# Patient Record
Sex: Female | Born: 1980 | Race: Black or African American | Hispanic: No | Marital: Single | Smoking: Never smoker
Health system: Southern US, Community
[De-identification: ages and names within clinical notes are randomized; demographics above are authoritative.]

## PROBLEM LIST (undated history)

## (undated) ENCOUNTER — Inpatient Hospital Stay (HOSPITAL_COMMUNITY): Payer: Self-pay

## (undated) DIAGNOSIS — D649 Anemia, unspecified: Secondary | ICD-10-CM

## (undated) DIAGNOSIS — Z789 Other specified health status: Secondary | ICD-10-CM

## (undated) HISTORY — PX: NO PAST SURGERIES: SHX2092

---
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
## 2001-02-17 ENCOUNTER — Emergency Department (HOSPITAL_COMMUNITY): Admission: EM | Admit: 2001-02-17 | Discharge: 2001-02-17 | Payer: Self-pay | Admitting: *Deleted

## 2001-07-04 ENCOUNTER — Emergency Department (HOSPITAL_COMMUNITY): Admission: EM | Admit: 2001-07-04 | Discharge: 2001-07-04 | Payer: Self-pay | Admitting: Emergency Medicine

## 2002-01-23 ENCOUNTER — Emergency Department (HOSPITAL_COMMUNITY): Admission: EM | Admit: 2002-01-23 | Discharge: 2002-01-23 | Payer: Self-pay | Admitting: Emergency Medicine

## 2002-02-23 ENCOUNTER — Emergency Department (HOSPITAL_COMMUNITY): Admission: EM | Admit: 2002-02-23 | Discharge: 2002-02-23 | Payer: Self-pay | Admitting: Emergency Medicine

## 2002-02-23 ENCOUNTER — Encounter: Payer: Self-pay | Admitting: Emergency Medicine

## 2003-05-10 ENCOUNTER — Emergency Department (HOSPITAL_COMMUNITY): Admission: AD | Admit: 2003-05-10 | Discharge: 2003-05-10 | Payer: Self-pay | Admitting: Emergency Medicine

## 2004-02-24 ENCOUNTER — Ambulatory Visit: Payer: Self-pay | Admitting: Family Medicine

## 2004-07-21 ENCOUNTER — Ambulatory Visit: Payer: Self-pay | Admitting: Internal Medicine

## 2006-03-10 ENCOUNTER — Ambulatory Visit: Payer: Self-pay | Admitting: Gynecology

## 2006-03-23 ENCOUNTER — Other Ambulatory Visit: Admission: RE | Admit: 2006-03-23 | Discharge: 2006-03-23 | Payer: Self-pay | Admitting: Gynecology

## 2006-03-23 ENCOUNTER — Ambulatory Visit: Payer: Self-pay | Admitting: Gynecology

## 2006-03-23 ENCOUNTER — Encounter (INDEPENDENT_AMBULATORY_CARE_PROVIDER_SITE_OTHER): Payer: Self-pay | Admitting: Specialist

## 2006-04-16 ENCOUNTER — Inpatient Hospital Stay (HOSPITAL_COMMUNITY): Admission: AD | Admit: 2006-04-16 | Discharge: 2006-04-16 | Payer: Self-pay | Admitting: Obstetrics & Gynecology

## 2006-06-12 ENCOUNTER — Emergency Department (HOSPITAL_COMMUNITY): Admission: EM | Admit: 2006-06-12 | Discharge: 2006-06-12 | Payer: Self-pay | Admitting: Family Medicine

## 2006-08-21 ENCOUNTER — Ambulatory Visit: Payer: Self-pay | Admitting: Family Medicine

## 2006-11-06 ENCOUNTER — Emergency Department (HOSPITAL_COMMUNITY): Admission: EM | Admit: 2006-11-06 | Discharge: 2006-11-06 | Payer: Self-pay | Admitting: Emergency Medicine

## 2007-05-07 ENCOUNTER — Emergency Department (HOSPITAL_COMMUNITY): Admission: EM | Admit: 2007-05-07 | Discharge: 2007-05-07 | Payer: Self-pay | Admitting: Family Medicine

## 2007-06-13 ENCOUNTER — Emergency Department (HOSPITAL_COMMUNITY): Admission: EM | Admit: 2007-06-13 | Discharge: 2007-06-13 | Payer: Self-pay | Admitting: Family Medicine

## 2008-06-10 ENCOUNTER — Emergency Department (HOSPITAL_COMMUNITY): Admission: EM | Admit: 2008-06-10 | Discharge: 2008-06-10 | Payer: Self-pay | Admitting: Family Medicine

## 2008-10-15 ENCOUNTER — Emergency Department (HOSPITAL_COMMUNITY): Admission: EM | Admit: 2008-10-15 | Discharge: 2008-10-16 | Payer: Self-pay | Admitting: Emergency Medicine

## 2010-03-12 ENCOUNTER — Emergency Department (HOSPITAL_COMMUNITY)
Admission: EM | Admit: 2010-03-12 | Discharge: 2010-03-12 | Payer: Self-pay | Source: Home / Self Care | Admitting: Emergency Medicine

## 2010-05-31 LAB — RAPID STREP SCREEN (MED CTR MEBANE ONLY): Streptococcus, Group A Screen (Direct): NEGATIVE

## 2010-08-06 NOTE — Discharge Summary (Signed)
[facility]  Patient:    Morgan Howell, Morgan Howell                        MRN: [id number] Adm. Date:  [id number] Disc. Date: [id number] Attending:  Doug Sou Dictator:   Raynelle Jan                           Discharge Summary  DATE OF BIRTH:                06-May-1980  ADMISSION HISTORY:            This is an 30 year old gravida 2, para 1-0-0-1 with an approximate gestational age of 15-[redacted] weeks gestation who presents to maternity admissions with a 24 hour history of repetitive vomiting progressing to bilious vomiting.  She has a child who has been sick with a similar illness and has been unable to tolerate liquids.  MEDICATIONS:                  None.  ALLERGIES:                    No known drug allergies.  PAST MEDICAL HISTORY:         Significant for chlamydia, trich, and HPV as well as sickle cell trait and anemia.  PHYSICAL EXAMINATION:  VITAL SIGNS:                  Afebrile.  Vitals were stable.  GENERAL:                      She was vomiting and in some distress.  ABDOMEN:                      Fetal heart tones were Dopplered and her abdomen was unremarkable.  LABORATORIES:                 Normal comprehensive metabolic panel.  CBC significant for hemoglobin of 9.3 and hematocrit of 25.4.  Her white count was only 5.9; however, she did have a left shift with 90% neutrophils.  Her wet prep showed many trich and many wbcs.                                Therefore, she was admitted with a viral gastroenteritis and dehydration as well as anemia and trichomoniasis.  HOSPITAL COURSE:              She was admitted and started on IV hydration. She subsequently developed fevers as high as 100.6; however, within 24 hours of her admission she was doing 100% better with no further nausea and vomiting on the morning of discharge.  At discharge her temperature was 100.2.  The rest of her vitals were stable.  Fetal heart tones were Dopplered at  discharge at a rate of 142.  Her examination was significant for a soft abdomen that was nontender, nondistended with positive bowel sounds.  UA at discharge showed moderate leukocyte esterase, negative nitrites, 0-5 wbcs, and trich.  She received her treatment for the trichomoniasis on the seventeenth and took 2 g of Flagyl p.o. x 1.  She will therefore be discharged to home in stable condition.  DISCHARGE MEDICATIONS:        Prenatal vitamins and Tylenol  for temperature.  DISCHARGE DIET:               She is to have a bland diet for the next two to three days until the gastroenteritis passes.  DISCHARGE FOLLOW-UP:          If she continues to do well with no further nausea and vomiting, she can follow up on December 6 with a previously scheduled appointment. DD:  02/06/00 TD:  02/06/00 Job: [id number]

## 2010-08-06 NOTE — Discharge Summary (Signed)
[facility]  Patient:    WINDI, TORO                        MRN: [id number] Adm. Date:  [id number] Disc. Date: [id number] Attending:  Doug Sou Dictator:   Raynelle Jan, M.D.                           Discharge Summary  DATE OF BIRTH:                1980-12-31.  HISTORY OF PRESENT ILLNESS:   This is an 30 year old gravida 2, para 1-0-0-1, with an estimated gestational age at approximately 16-19 weeks who presents with vomiting on the night of admission since 11 p.m. the previous night.  The vomiting is repetitive and is currently bilious.  Her child had been sick with a similar illness within the past week.  MEDICATIONS:                  None.  ALLERGIES:                    No known drug allergies.  PAST MEDICAL HISTORY:         Chlamydia, Trichomonas, sickle cell trait, and anemia.  PHYSICAL EXAMINATION:         On admission, she had a temperature of 98.5, a pulse of 80, respiration were 20, and BP was 101/57.  In general, she was vomiting and in some mild distress.  Abdomen was significant for being soft, nontender, fetal heart tones were dopplered.  Genitalia exam was deferred.  LABORATORIES ON ADMISSION:    UA significant for ketones of 80.  Her comprehensive metabolic panel was within normal limits.  Her CBC just showed anemia with an H&H of 9.3 and 25.4 and a white count 5.9 with a left shift consisting of 90% neutrophils. DD:  02/06/00 TD:  02/06/00 Job: [id number]

## 2010-08-06 NOTE — Group Therapy Note (Signed)
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  Apparently, she had been  originally scheduled for a colposcopy here but in view of the pathology  report and colposcopic report, I took the liberty of discussing this  with the patient, her findings, and she is going to have a LEEP  scheduled.  In the meantime, she will watch a LEEP video.           ______________________________  Ginger Carne, MD     SHB/MEDQ  D:  03/10/2006  T:  03/10/2006  Job:  [id number]

## 2010-08-14 ENCOUNTER — Inpatient Hospital Stay (HOSPITAL_COMMUNITY)
Admission: AD | Admit: 2010-08-14 | Discharge: 2010-08-14 | Disposition: A | Discharge status: Home / Self Care | Payer: 59 | Source: Ambulatory Visit | Attending: Obstetrics and Gynecology | Admitting: Obstetrics and Gynecology

## 2010-08-14 DIAGNOSIS — A6000 Herpesviral infection of urogenital system, unspecified: Secondary | ICD-10-CM

## 2010-08-14 DIAGNOSIS — N949 Unspecified condition associated with female genital organs and menstrual cycle: Secondary | ICD-10-CM

## 2010-08-14 DIAGNOSIS — J069 Acute upper respiratory infection, unspecified: Secondary | ICD-10-CM

## 2010-08-14 LAB — URINALYSIS, ROUTINE W REFLEX MICROSCOPIC
Glucose, UA: NEGATIVE mg/dL
Ketones, ur: NEGATIVE mg/dL
Protein, ur: NEGATIVE mg/dL

## 2010-08-14 LAB — WET PREP, GENITAL: Clue Cells Wet Prep HPF POC: NONE SEEN

## 2010-08-14 LAB — URINE MICROSCOPIC-ADD ON

## 2010-08-16 LAB — HERPES SIMPLEX VIRUS CULTURE: Culture: NOT DETECTED

## 2010-08-17 LAB — GC/CHLAMYDIA PROBE AMP, GENITAL: Chlamydia, DNA Probe: NEGATIVE

## 2010-11-30 ENCOUNTER — Ambulatory Visit (INDEPENDENT_AMBULATORY_CARE_PROVIDER_SITE_OTHER): Payer: 59 | Admitting: Family Medicine

## 2010-11-30 ENCOUNTER — Encounter: Payer: Self-pay | Admitting: Family Medicine

## 2010-11-30 VITALS — BP 116/80 | HR 83 | Wt 154.0 lb

## 2010-11-30 DIAGNOSIS — R21 Rash and other nonspecific skin eruption: Secondary | ICD-10-CM

## 2010-11-30 NOTE — Progress Notes (Signed)
  Subjective:    Patient ID: Morgan Howell, female    DOB: [birth year], 30 y.o.   MRN: [id number]  HPI She is here for evaluation of a rash and swelling of the lips. She ate some shellfish and approximately one hour later noted some swelling of the lips. She did not have any itching, wheal or flare. Presently she is not having this symptom was mainly concerned about redness and itching to her arms down to her hands but not including the palms. She does have allergy to strawberries but does not remember taking this.   Review of Systems     Objective:   Physical Exam alert and in no distress. Tympanic membranes and canals are normal. Throat is clear. Tonsils are normal. Neck is supple without adenopathy or thyromegaly. Cardiac exam shows a regular sinus rhythm without murmurs or gallops. Lungs are clear to auscultation. Exam of the skin does show erythema and a fine sandpaper type rash mainly on her forearms. Strep screen is negative       Assessment & Plan:  Dermatitis, etiology unclear Recommend supportive care with Benadryl at night and Claritin during the day area and call if further difficulties.

## 2010-11-30 NOTE — Patient Instructions (Signed)
Use Benadryl at night and Claritin during the day. Keep track of anything that might make this worse.

## 2010-12-31 LAB — URINE MICROSCOPIC-ADD ON

## 2010-12-31 LAB — URINALYSIS, ROUTINE W REFLEX MICROSCOPIC
Bilirubin Urine: NEGATIVE
Glucose, UA: NEGATIVE
Protein, ur: NEGATIVE
Specific Gravity, Urine: 1.015

## 2011-08-10 ENCOUNTER — Other Ambulatory Visit: Payer: Self-pay | Admitting: Family Medicine

## 2011-08-10 DIAGNOSIS — N63 Unspecified lump in unspecified breast: Secondary | ICD-10-CM

## 2011-08-18 ENCOUNTER — Ambulatory Visit
Admission: RE | Admit: 2011-08-18 | Discharge: 2011-08-18 | Disposition: A | Discharge status: Home / Self Care | Payer: 59 | Source: Ambulatory Visit | Attending: Family Medicine | Admitting: Family Medicine

## 2011-08-18 DIAGNOSIS — N63 Unspecified lump in unspecified breast: Secondary | ICD-10-CM

## 2012-01-10 ENCOUNTER — Ambulatory Visit (INDEPENDENT_AMBULATORY_CARE_PROVIDER_SITE_OTHER): Payer: 59 | Admitting: Medical

## 2012-01-10 ENCOUNTER — Encounter: Payer: Self-pay | Admitting: Medical

## 2012-01-10 VITALS — BP 110/80 | HR 64 | Temp 98.5°F | Resp 16 | Wt 178.0 lb

## 2012-01-10 DIAGNOSIS — M79609 Pain in unspecified limb: Secondary | ICD-10-CM

## 2012-01-10 DIAGNOSIS — M62838 Other muscle spasm: Secondary | ICD-10-CM

## 2012-01-10 DIAGNOSIS — M549 Dorsalgia, unspecified: Secondary | ICD-10-CM

## 2012-01-10 DIAGNOSIS — M79602 Pain in left arm: Secondary | ICD-10-CM

## 2012-01-10 DIAGNOSIS — M546 Pain in thoracic spine: Secondary | ICD-10-CM

## 2012-01-10 DIAGNOSIS — R0789 Other chest pain: Secondary | ICD-10-CM

## 2012-01-10 DIAGNOSIS — R071 Chest pain on breathing: Secondary | ICD-10-CM

## 2012-01-10 MED ORDER — DICLOFENAC SODIUM 75 MG PO TBEC: 75.0000 mg | DELAYED_RELEASE_TABLET | Freq: Two times a day (BID) | ORAL | Status: DC

## 2012-01-10 MED ORDER — CYCLOBENZAPRINE HCL 10 MG PO TABS: 10.0000 mg | ORAL_TABLET | Freq: Two times a day (BID) | ORAL | Status: DC | PRN

## 2012-01-10 NOTE — Patient Instructions (Signed)
Chest Wall Pain Chest wall pain is pain in or around the bones and muscles of your chest. It may take up to 6 weeks to get better. It may take longer if you must stay physically active in your work and activities.  CAUSES  Chest wall pain may happen on its own. However, it may be caused by:  A viral illness like the flu.  Injury.  Coughing.  Exercise.  Arthritis.  Fibromyalgia.  Shingles. HOME CARE INSTRUCTIONS   Avoid overtiring physical activity. Try not to strain or perform activities that cause pain. This includes any activities using your chest or your abdominal and side muscles, especially if heavy weights are used.  Put ice on the sore area.  Put ice in a plastic bag.  Place a towel between your skin and the bag.  Leave the ice on for 15 to 20 minutes per hour while awake for the first 2 days.  Only take over-the-counter or prescription medicines for pain, discomfort, or fever as directed by your caregiver. SEEK IMMEDIATE MEDICAL CARE IF:   Your pain increases, or you are very uncomfortable.  You have a fever.  Your chest pain becomes worse.  You have new, unexplained symptoms.  You have nausea or vomiting.  You feel sweaty or lightheaded.  You have a cough with phlegm (sputum), or you cough up blood. MAKE SURE YOU:   Understand these instructions.  Will watch your condition.  Will get help right away if you are not doing well or get worse. Document Released: 03/07/2005 Document Revised: 05/30/2011 Document Reviewed: 11/01/2010 Exit[employer] Patient Information 2013 [employer].  

## 2012-01-10 NOTE — Progress Notes (Signed)
Subjective: Here for c/o chest pain.   She notes one similar episode in the past where she was seen in the ED, diagnosed with chest wall pain which resolved with medication.  She notes no recent heavy lifting, strenuous activity, but over the weekend began having pain in her chest, shoulder, arm, and upper back.  Though it was indigestion so she took anti gas medication without relief.  She notes the pain is "shocking" in the chest and getting some tingling in the left arm, but no associated SOB, sweating, numbness, jaw pain or substernal pressure.   These are intermittent shocking pains lasting for minutes at a time.   She notes no family hx/o heart disease.  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  No hx/o shoulder problems or neck problems. No other aggravating or relieving factors.   No past medical history on file.  ROS as in HPI    Objective:   Physical Exam  Filed Vitals:   01/10/12 1025  BP: 110/80  Pulse: 64  Temp: 98.5 F (36.9 C)  Resp: 16    General appearance: alert, no distress, WD/WN,  AA female Neck: supple, no lymphadenopathy, no thyromegaly, no masses, normal ROM Heart: RRR, normal S1, S2, no murmurs Chest wall tender throughout right side. Lungs: CTA bilaterally, no wheezes, rhonchi, or rales Pulses: 2+ symmetric, upper and lower extremities, normal cap refill MSK: tender along right deltoid, upper arm, mild pain with right shoulder flexion, abduction.  ROM otherwise nontender, negative special tests.  Back: tender left supraspinatus muscle, tender entire right upper paraspinal muscles Ext: no edema Neuro: normal UE strength, sensation and DTRs.  Assessment and Plan :     Encounter Diagnoses  Name Primary?  . Chest wall pain Yes  . Muscle spasm   . Arm pain, left   . Pain, upper back    Etiology appears to be musculoskeletal chest, back and shoulder pain, most likely inflammatory.   Advised relative rest, gentle ROM and stretching exercise, heat, begin  Diclofenac BID, Flexeril QHS, and consider massage therapy in 2-3 days.   If not improving in 1 wk, then return.

## 2012-04-30 ENCOUNTER — Ambulatory Visit (INDEPENDENT_AMBULATORY_CARE_PROVIDER_SITE_OTHER): Payer: 59 | Admitting: Medical

## 2012-04-30 ENCOUNTER — Ambulatory Visit
Admission: RE | Admit: 2012-04-30 | Discharge: 2012-04-30 | Disposition: A | Discharge status: Home / Self Care | Payer: 59 | Source: Ambulatory Visit | Attending: Medical | Admitting: Medical

## 2012-04-30 ENCOUNTER — Encounter: Payer: Self-pay | Admitting: Medical

## 2012-04-30 VITALS — BP 112/80 | HR 68 | Temp 98.0°F | Resp 20 | Wt 175.0 lb

## 2012-04-30 DIAGNOSIS — R059 Cough, unspecified: Secondary | ICD-10-CM

## 2012-04-30 DIAGNOSIS — R52 Pain, unspecified: Secondary | ICD-10-CM

## 2012-04-30 DIAGNOSIS — R61 Generalized hyperhidrosis: Secondary | ICD-10-CM

## 2012-04-30 DIAGNOSIS — R079 Chest pain, unspecified: Secondary | ICD-10-CM

## 2012-04-30 DIAGNOSIS — R05 Cough: Secondary | ICD-10-CM

## 2012-04-30 DIAGNOSIS — H109 Unspecified conjunctivitis: Secondary | ICD-10-CM

## 2012-04-30 LAB — CBC WITH DIFFERENTIAL/PLATELET
Lymphocytes Relative: 22 % (ref 12–46)
Neutro Abs: 5.6 10*3/uL (ref 1.7–7.7)
Platelets: 196 10*3/uL (ref 150–400)
RDW: 12.4 % (ref 11.5–15.5)
WBC: 7.7 10*3/uL (ref 4.0–10.5)

## 2012-04-30 NOTE — Progress Notes (Signed)
Subjective:  Morgan Howell is a 32 y.o. female who presents for cough and illness.  Started 4 days ago as sore throat. Been in bed mostly since last Wednesday.   She reports sore throat, body aches, eyes watery, cough, runny nose, sneezing, but no fever, no nausea, no vomiting, no diarrhea.  No rash.  No sick contacts. Feels like someone beat her up.  Has some productive cough.  Using some tylenol and this is helping.  Using Mucinex.  No other aggravating or relieving factors.   I saw her back in 12/2011 for atypical chest pain, suspected chest wall pain.   However, she used the muscle relaxer, NSAID, but still gets these intermittent chest pains at rest, regardless of activity.  Prior to seeing me in October, had been seen in the ED, diagnosed with chest wall pain which resolved with medication. She notes no recent heavy lifting, strenuous activity.  The pain is "shocking" in the chest and getting some tingling in the left arm, but no associated SOB, sweating, numbness, jaw pain or substernal pressure.  Pains can be out of the blue, crampy spasm like pain under the sternum.  These are intermittent shocking pains lasting for minutes at a time. She notes no family hx/o heart disease. 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 No hx/o shoulder problems or neck problem  The following portions of the patient's history were reviewed and updated as appropriate: allergies, current medications, past family history, past medical history, past social history, past surgical history and problem list.  Objective: Vital signs reviewed  General: Ill-appearing, well-developed, well-nourished Skin: warm, dry HEENT: Nose inflamed and congested, clear conjunctiva, TMs pearly, no sinus tenderness, pharynx with erythema, no exudates Neck: Supple, nontender, shotty cervical adenopathy Chest wall: nontender Heart: Regular rate and rhythm, normal S1, S2, no murmurs Lungs: Clear to auscultation bilaterally, no wheezes,  rales, rhonchi Abdomen: Nontender non distended Extremities: Mild generalized tenderness Pulses normal   Adult ECG Report  Indication: chest pain, atypical  Rate: 82 bpm  Rhythm: normal sinus rhythm  QRS Axis: 60 degrees  PR Interval: 138 ms  QRS Duration: 74 ms  QTc: 436 ms  Conduction Disturbances: none  Other Abnormalities: T wave inversion  Patient's cardiac risk factors are: none.  EKG comparison: none  Narrative Interpretation: T wave inversion V2, no other acute changes  Assessment and Plan: Encounter Diagnoses  Name Primary?  . Cough Yes  . Body aches   . Night sweats   . Chest pain   . Conjunctivitis    Cough, body aches, night sweats - flu test negative.  Will check CBC, CXR.  advised that etiology likely viral respiratory illness, flu like syndrome.  Discussed supportive care including rest, hydration, OTC Tylenol or NSAID for fever, aches, and malaise.  Discussed spread of disease, discussed means of transmission, and possible complications including pneumonia.  If worse or not improving within the next 3-4 days, then call or return.  Chest pain - atypical, but given the ongoing intermittent nature, can't rule out Prinzmetals or other cause.  She didn't have improvment with NSAIDs, muscles relaxers.  Chest wall pain doesn't seem to be etiology currently.  Conjunctivitis - OTC allergy eye drops, rest, should resolve spontaneously.  Gave note for work.

## 2012-05-01 ENCOUNTER — Telehealth: Payer: Self-pay | Admitting: Family Medicine

## 2012-05-01 NOTE — Telephone Encounter (Signed)
Jo added on the BMET and the TSH. CLS I fax over OV note, labs, and EKG to [facility] per Crosby Oyster PA-C. CLS

## 2012-05-01 NOTE — Telephone Encounter (Signed)
Message copied by Janeice Robinson on Tue May 01, 2012  8:52 AM ------      Message from: Jac Canavan      Created: Mon Apr 30, 2012  8:32 PM       pls have Alvino Chapel add TSH, BMET.              Go ahead and make tentative referral to [facility] for atypical chest pain, "rule out prinzmetals"  Send  ------

## 2012-05-08 ENCOUNTER — Telehealth: Payer: Self-pay | Admitting: Family Medicine

## 2012-05-08 NOTE — Telephone Encounter (Signed)
Patient is aware that she needs to come by the office to have a re draw on her lab work. CLs

## 2012-05-08 NOTE — Telephone Encounter (Signed)
Message copied by Janeice Robinson on Tue May 08, 2012  3:21 PM ------      Message from: Jac Canavan      Created: Mon May 07, 2012  8:30 AM       I believe the recent BMET and TSH lab were not able to be run.  pls have her come by at her convenience for those 2 labs.  C/t with plan for cardiology referral. ------

## 2012-05-23 ENCOUNTER — Telehealth: Payer: Self-pay | Admitting: Family Medicine

## 2012-05-23 NOTE — Telephone Encounter (Signed)
Patient has an appointment on 05/24/12 but she called and she rescheduled the appointment for 06/04/12 @ 300 pm to see Dr. Elio Forget. CLS Patient is aware that she needs additional lab work. CLS

## 2012-05-23 NOTE — Telephone Encounter (Signed)
Message copied by Janeice Robinson on Wed May 23, 2012  9:28 AM ------      Message from: Aleen Campi, DAVID S      Created: Wed May 23, 2012  4:10 AM       What is the status of the [facility] referral and coming back in for the 2 additional labs? ------

## 2014-06-05 IMAGING — CR DG CHEST 2V
2 series · 2 of 2 positions shown · non-contrast
Comparison: None.

CLINICAL DATA: Cough, congestion, chest pain and body aches.
Diaphoresis.

CHEST - 2 VIEW

[w chest pa]
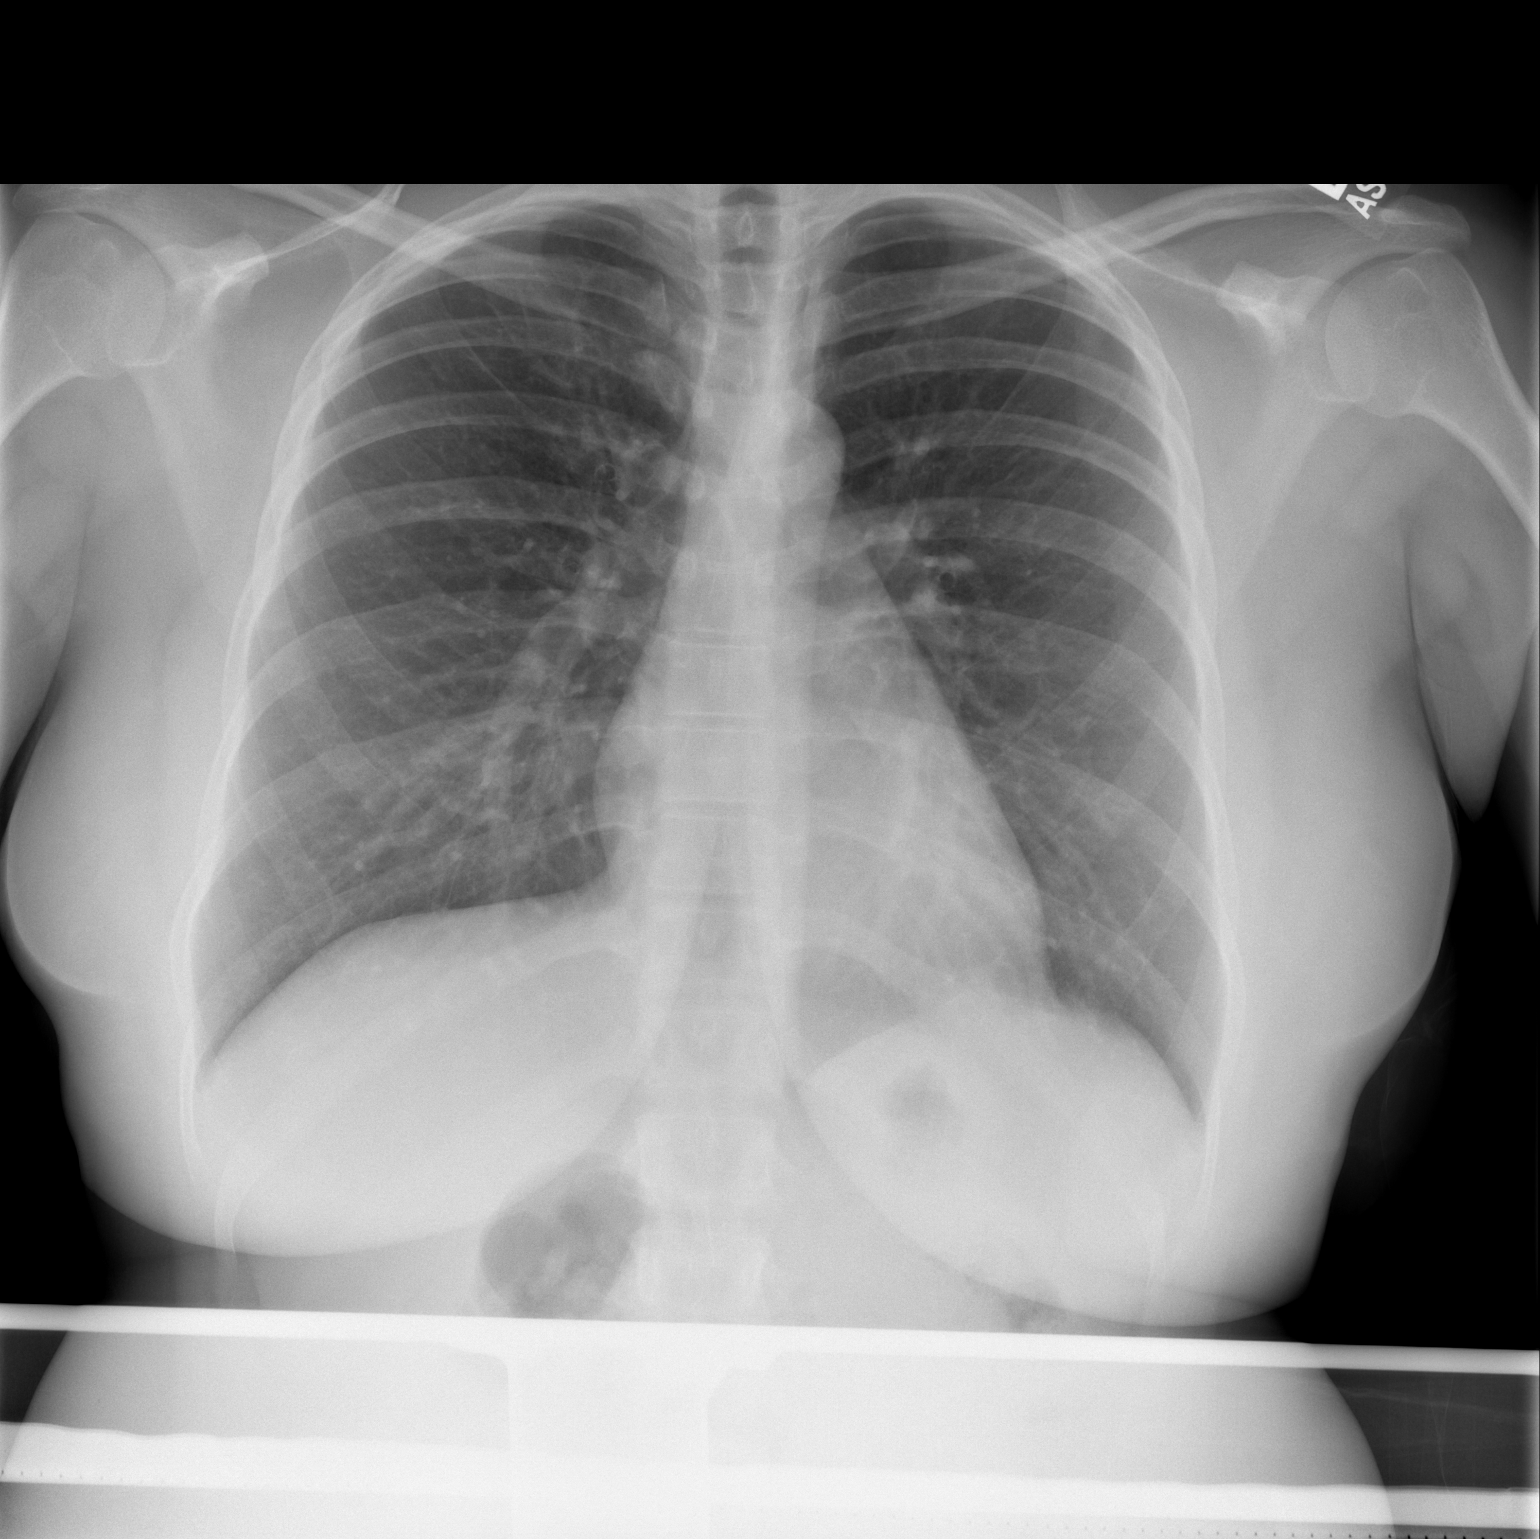

[w chest lat]
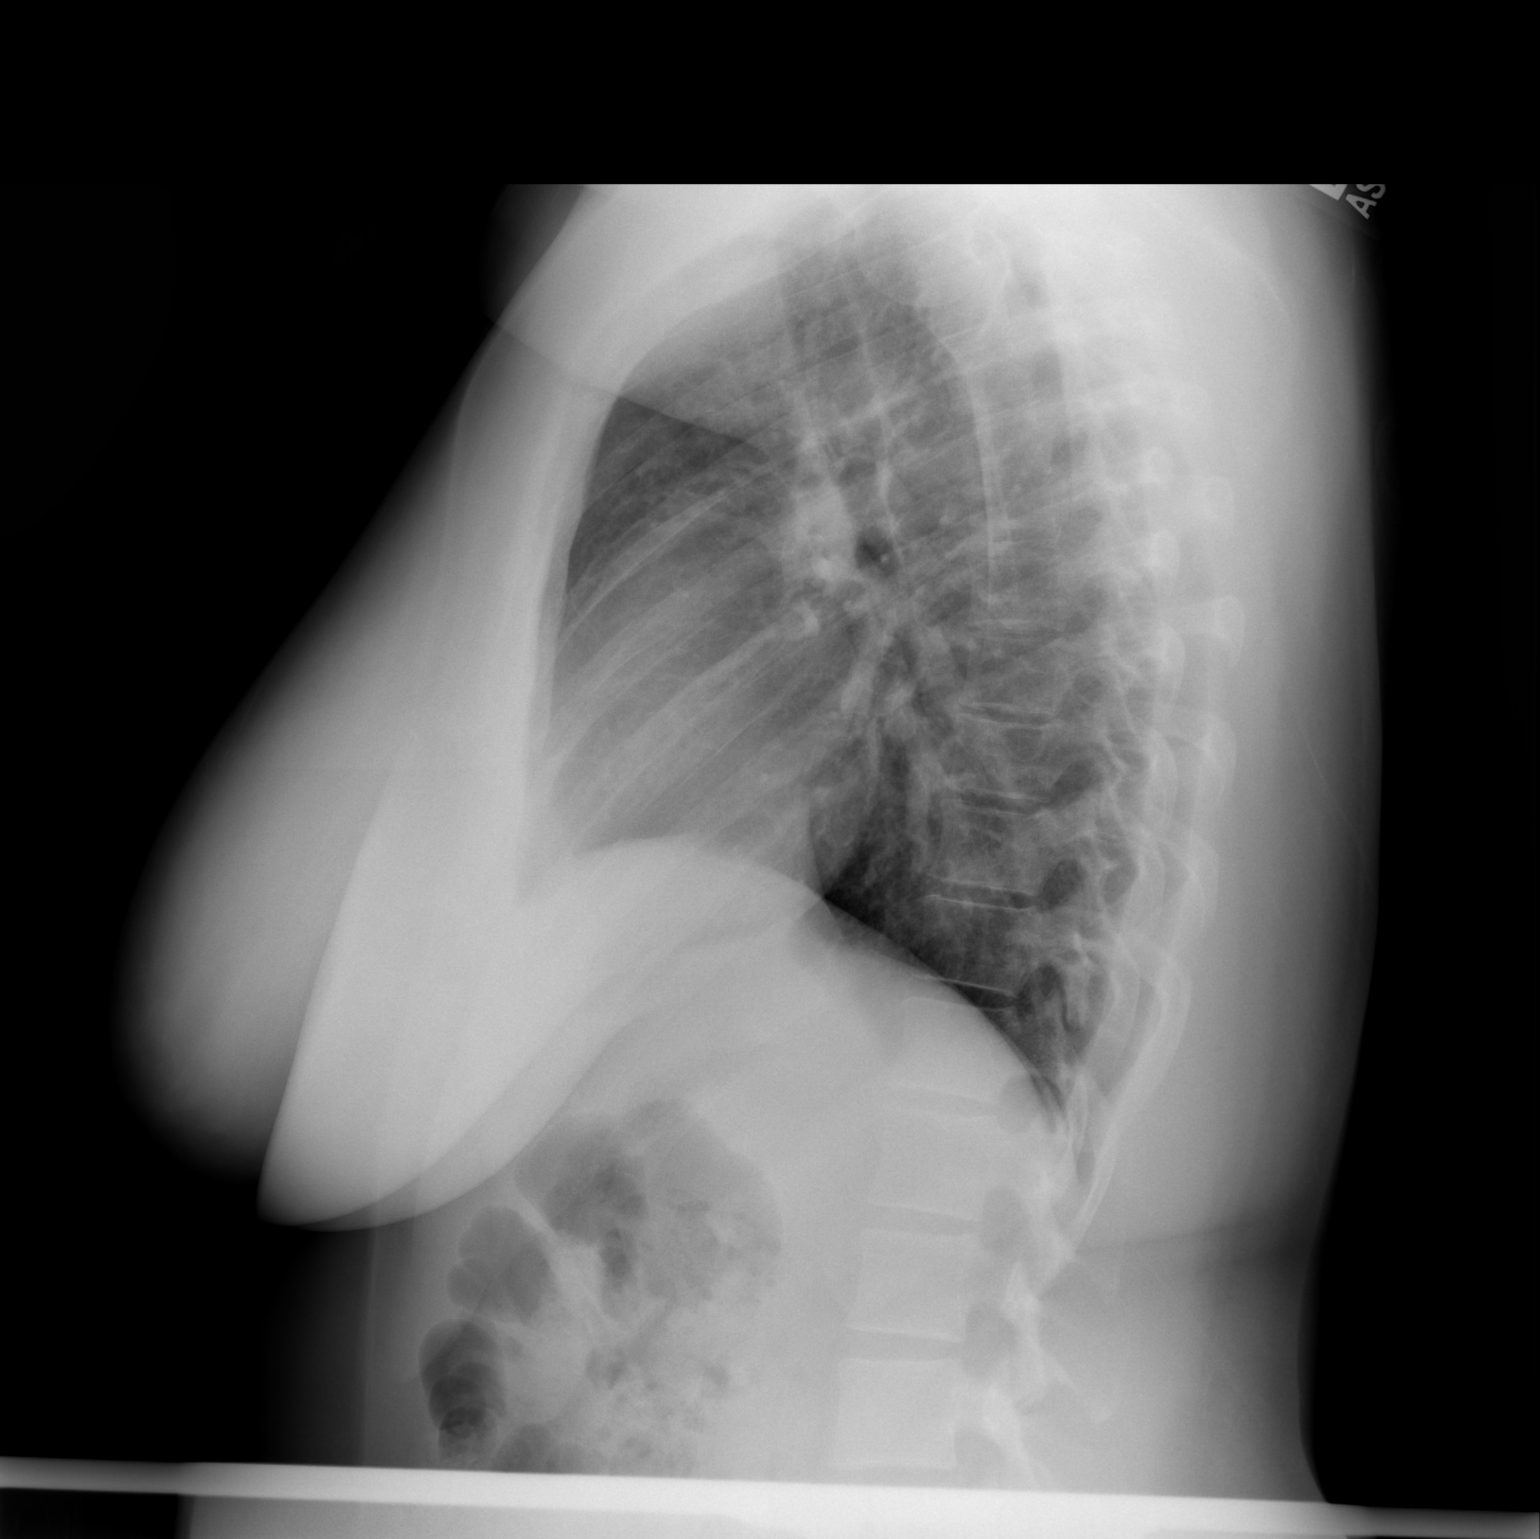

[2 of 2 positions shown; findings below may reference images not displayed]

FINDINGS: Trachea is midline.  Heart size normal.  Lungs are clear.
No pleural fluid.
IMPRESSION: No acute findings.

## 2014-09-08 ENCOUNTER — Other Ambulatory Visit: Payer: Self-pay | Admitting: Nurse Practitioner

## 2014-09-08 DIAGNOSIS — T8389XA Other specified complication of genitourinary prosthetic devices, implants and grafts, initial encounter: Secondary | ICD-10-CM

## 2014-09-10 ENCOUNTER — Ambulatory Visit
Admission: RE | Admit: 2014-09-10 | Discharge: 2014-09-10 | Disposition: A | Discharge status: Home / Self Care | Payer: Self-pay | Source: Ambulatory Visit | Attending: Nurse Practitioner | Admitting: Nurse Practitioner

## 2014-09-10 DIAGNOSIS — T8389XA Other specified complication of genitourinary prosthetic devices, implants and grafts, initial encounter: Secondary | ICD-10-CM

## 2014-10-23 ENCOUNTER — Ambulatory Visit (INDEPENDENT_AMBULATORY_CARE_PROVIDER_SITE_OTHER): Payer: Self-pay | Admitting: Obstetrics & Gynecology

## 2014-10-23 ENCOUNTER — Encounter: Payer: Self-pay | Admitting: Obstetrics & Gynecology

## 2014-10-23 VITALS — BP 120/82 | HR 92 | Ht 61.0 in | Wt 163.3 lb

## 2014-10-23 DIAGNOSIS — Z30432 Encounter for removal of intrauterine contraceptive device: Secondary | ICD-10-CM

## 2014-10-23 DIAGNOSIS — T8332XA Displacement of intrauterine contraceptive device, initial encounter: Secondary | ICD-10-CM

## 2014-10-23 NOTE — Patient Instructions (Signed)
Return to clinic for any scheduled appointments or for any gynecologic concerns as needed.   

## 2014-10-23 NOTE — Progress Notes (Signed)
    GYNECOLOGY CLINIC PROCEDURE ATTEMPT NOTE  Morgan Howell is a 34 y.o. [phone] here for Mirena IUD removal. Was evaluated at [facility] and strings were not visualized.Had follow up ultrasound, see below.  She is currently on Depo Provera. Last pap smear was in 08/2014 and was normal.  09/10/2014   CLINICAL DATA:  IUD loss recently.  Pelvic pain.  EXAM: TRANSABDOMINAL AND TRANSVAGINAL ULTRASOUND OF PELVIS  TECHNIQUE: Both transabdominal and transvaginal ultrasound examinations of the pelvis were performed. Transabdominal technique was performed for global imaging of the pelvis including uterus, ovaries, adnexal regions, and pelvic cul-de-sac. It was necessary to proceed with endovaginal exam following the transabdominal exam to visualize the uterus.  COMPARISON:  None  FINDINGS: Uterus  Measurements: 9.3 x 4.3 x 5.8 cm. 2.4 x 2.6 x 2.6 cm right lateral fibroid.  Endometrium  Thickness: 2 mm. No focal abnormality visualized. An echogenic foci is noted in the lower cervical upper vaginal region. This could represent the lost IUD.  Right ovary  Measurements: 2.5 x 2.5 x 2.1 cm. Multiple follicles .  Left ovary  Measurements: 3.4 x 1.5 x 2.8 cm. Multiple follicles.  Other findings  Trace free pelvic fluid .  IMPRESSION: 1. Echogenic density is noted in the lower cervical upper vaginal region. This could represent the lost IUD.  2.  Uterine fibroid.   Electronically Signed   By: Maisie Fus  Register   On: 09/10/2014 15:57   IUD Removal Attempt Patient identified, informed consent performed, consent signed.  Patient was in the dorsal lithotomy position, normal external genitalia was noted.  A speculum was placed in the patient's vagina, normal discharge was noted, no lesions, no IUD visualized. The cervix was visualized, no lesions, no abnormal discharge, no strings and no IUD.  Cervix was swabbed with Betadine, Kelly forceps were introduced into the endometrial cavity and the IUD was not palpated. IUD hook was used,  nothing was palpated in uterus or cervix.  Procedure was aborted. There was minimal bleeding, and patient tolerated the procedure well.    On personal review of images, nothing was seen in the uterus or cervix corresponding to an IUD. This was communicated to patient. She denies noting expulsion. She was offered pelvic X-ray to evaluate for extrauterine position of the IUD, but she declines for now. Also offered hysteroscopy, but she also declines for now due to cost.    She was advised to call or come in for any concerning symptoms.  Total face-to-face time with patient: 20 minutes. Over 50% of encounter was spent on counseling and coordination of care.   Jaynie Collins, MD, FACOG Attending Obstetrician & Gynecologist Center for [employer], [facility]

## 2014-12-18 ENCOUNTER — Inpatient Hospital Stay (HOSPITAL_COMMUNITY)
Admission: AD | Admit: 2014-12-18 | Discharge: 2014-12-18 | Disposition: A | Discharge status: Home / Self Care | Payer: Self-pay | Source: Ambulatory Visit | Attending: Obstetrics and Gynecology | Admitting: Obstetrics and Gynecology

## 2014-12-18 ENCOUNTER — Encounter (HOSPITAL_COMMUNITY): Payer: Self-pay | Admitting: *Deleted

## 2014-12-18 DIAGNOSIS — N390 Urinary tract infection, site not specified: Secondary | ICD-10-CM | POA: Insufficient documentation

## 2014-12-18 HISTORY — DX: Other specified health status: Z78.9

## 2014-12-18 LAB — URINALYSIS, ROUTINE W REFLEX MICROSCOPIC
BILIRUBIN URINE: NEGATIVE
GLUCOSE, UA: 250 mg/dL — AB
KETONES UR: 15 mg/dL — AB
Nitrite: POSITIVE — AB
PH: 5 (ref 5.0–8.0)
Specific Gravity, Urine: 1.025 (ref 1.005–1.030)
Urobilinogen, UA: >8 mg/dL — ABNORMAL HIGH (ref 0.0–1.0)

## 2014-12-18 LAB — URINE MICROSCOPIC-ADD ON

## 2014-12-18 LAB — POCT PREGNANCY, URINE: Preg Test, Ur: NEGATIVE

## 2014-12-18 MED ORDER — SULFAMETHOXAZOLE-TRIMETHOPRIM 800-160 MG PO TABS: 1.0000 | ORAL_TABLET | Freq: Two times a day (BID) | ORAL | Status: AC

## 2014-12-18 NOTE — Discharge Instructions (Signed)

## 2014-12-18 NOTE — MAU Note (Signed)
Pt reports lower abd pain, urinary frequency, and burning in vaginal area x 4 days.

## 2014-12-18 NOTE — MAU Provider Note (Signed)
History     CSN: [id number]  Arrival date and time: 12/18/14 4098   First Provider Initiated Contact with Patient 12/18/14 0505      No chief complaint on file.  Abdominal Pain This is a new problem. The current episode started in the past 7 days. The onset quality is gradual. The problem occurs intermittently. The problem has been unchanged. The pain is located in the suprapubic region. The pain is at a severity of 7/10. The quality of the pain is burning and cramping. The abdominal pain does not radiate. Associated symptoms include dysuria, frequency, hematuria and nausea. Pertinent negatives include no constipation, diarrhea, fever or vomiting. Nothing aggravates the pain. The pain is relieved by nothing. Treatments tried: urinary relief tablets and cranberry tablets  The treatment provided no relief.    Past Medical History  Diagnosis Date  . Medical history non-contributory     Past Surgical History  Procedure Laterality Date  . No past surgeries      History reviewed. No pertinent family history.  Social History  Substance Use Topics  . Smoking status: Never Smoker   . Smokeless tobacco: Never Used  . Alcohol Use: No    Allergies:  Allergies  Allergen Reactions  . Strawberry Anaphylaxis    Prescriptions prior to admission  Medication Sig Dispense Refill Last Dose  . phenazopyridine (PYRIDIUM) 200 MG tablet Take 200 mg by mouth 3 (three) times daily as needed for pain.   12/17/2014 at 2230    Review of Systems  Constitutional: Negative for fever.  Gastrointestinal: Positive for nausea and abdominal pain. Negative for vomiting, diarrhea and constipation.  Genitourinary: Positive for dysuria, urgency, frequency and hematuria.   Physical Exam   Blood pressure 134/73, pulse 79, temperature 98.2 F (36.8 C), temperature source Oral, resp. rate 18, height  (1.549 m), weight 73.483 kg (162 lb), SpO2 99 %.  Physical Exam  Nursing note and vitals  reviewed. Constitutional: She is oriented to person, place, and time. She appears well-developed and well-nourished. No distress.  HENT:  Head: Normocephalic.  Eyes: EOM are normal.  Cardiovascular: Normal rate.   Respiratory: Effort normal.  GI: Soft.  Musculoskeletal: Normal range of motion.  Neurological: She is alert and oriented to person, place, and time.  Skin: Skin is warm and dry.  Psychiatric: She has a normal mood and affect.   Results for orders placed or performed during the hospital encounter of 12/18/14 (from the past 24 hour(s))  Urinalysis, Routine w reflex microscopic (not at [facility])     Status: Abnormal   Collection Time: 12/18/14  3:55 AM  Result Value Ref Range   Color, Urine YELLOW YELLOW   APPearance CLEAR CLEAR   Specific Gravity, Urine 1.025 1.005 - 1.030   pH 5.0 5.0 - 8.0   Glucose, UA 250 (A) NEGATIVE mg/dL   Hgb urine dipstick LARGE (A) NEGATIVE   Bilirubin Urine NEGATIVE NEGATIVE   Ketones, ur 15 (A) NEGATIVE mg/dL   Protein, ur >119 (A) NEGATIVE mg/dL   Urobilinogen, UA >1.4 (H) 0.0 - 1.0 mg/dL   Nitrite POSITIVE (A) NEGATIVE   Leukocytes, UA MODERATE (A) NEGATIVE  Urine microscopic-add on     Status: Abnormal   Collection Time: 12/18/14  3:55 AM  Result Value Ref Range   Squamous Epithelial / LPF RARE RARE   WBC, UA 21-50 <3 WBC/hpf   RBC / HPF TOO NUMEROUS TO COUNT <3 RBC/hpf   Bacteria, UA MANY (A) RARE  Pregnancy, urine  POC     Status: None   Collection Time: 12/18/14  4:11 AM  Result Value Ref Range   Preg Test, Ur NEGATIVE NEGATIVE    MAU Course  Procedures  MDM   Assessment and Plan   1. UTI (lower urinary tract infection)    DC home Comfort measures reviewed  1st/2nd/3rd Trimester precautions  Bleeding precautions Ectopic precautions PTL precautions  Fetal kick counts RX: bactrim DS #10  Return to MAU as needed FU with OB as planned  Follow-up Information    Follow up with [facility] [place].   Contact  information:   [address] [facility] [address] [phone]        Tawnya Crook 12/18/2014, 5:07 AM

## 2015-03-02 LAB — OB RESULTS CONSOLE HEPATITIS B SURFACE ANTIGEN: HEP B S AG: NEGATIVE

## 2015-03-02 LAB — OB RESULTS CONSOLE RUBELLA ANTIBODY, IGM: RUBELLA: IMMUNE

## 2015-03-02 LAB — OB RESULTS CONSOLE HIV ANTIBODY (ROUTINE TESTING): HIV: NONREACTIVE

## 2015-03-02 LAB — OB RESULTS CONSOLE GC/CHLAMYDIA
CHLAMYDIA, DNA PROBE: NEGATIVE
GC PROBE AMP, GENITAL: NEGATIVE

## 2015-03-02 LAB — OB RESULTS CONSOLE RPR: RPR: NONREACTIVE

## 2015-03-22 NOTE — L&D Delivery Note (Signed)
Delivery Note At 10:22 AM a viable female was delivered via  (Presentation: LOA ).  APGAR: 8 ,9 ; weight pending.   Placenta status: intact, spontaneous .  Cord:  3 vessel with the following complications: none.   Anesthesia:  Fentanyl q1 Episiotomy:  none Lacerations:  1st degree periurethral hemostatic, 1st degree perineal 2 simple interrupted sutures Suture Repair: 3.0 vicryl Est. Blood Loss (mL):  150mL  Mom to postpartum.  Baby to Couplet care / Skin to Skin.  Morgan Howell is a 35 y.o. female G3P3001 with IUP at 9775w1d admitted for IOL for preeclampsia.  She progressed with augmentation to complete and pushed less than 30 minutes to deliver.  Cord clamping delayed by several minutes then clamped by MD and cut by pt.  Placenta intact and spontaneous, bleeding minimal.  1st degree perineal laceration repaired without difficulty.  Mom and baby stable prior to transfer to postpartum. [name].   [name] 10/01/2015, 10:45 AM  I was present for this delivery and agree with the above resident's note. LEFTWICH-KIRBY, Tennelle Taflinger, CNM 4:06 PM

## 2015-08-26 ENCOUNTER — Encounter (HOSPITAL_COMMUNITY): Payer: Self-pay | Admitting: *Deleted

## 2015-08-26 ENCOUNTER — Inpatient Hospital Stay (HOSPITAL_COMMUNITY): Payer: Medicaid Other

## 2015-08-26 ENCOUNTER — Inpatient Hospital Stay (HOSPITAL_COMMUNITY)
Admission: AD | Admit: 2015-08-26 | Discharge: 2015-08-26 | Disposition: A | Discharge status: Home / Self Care | Payer: Medicaid Other | Source: Ambulatory Visit | Attending: Obstetrics and Gynecology | Admitting: Obstetrics and Gynecology

## 2015-08-26 DIAGNOSIS — O9A219 Injury, poisoning and certain other consequences of external causes complicating pregnancy, unspecified trimester: Secondary | ICD-10-CM | POA: Diagnosis not present

## 2015-08-26 DIAGNOSIS — R109 Unspecified abdominal pain: Secondary | ICD-10-CM | POA: Insufficient documentation

## 2015-08-26 DIAGNOSIS — R55 Syncope and collapse: Secondary | ICD-10-CM | POA: Diagnosis not present

## 2015-08-26 DIAGNOSIS — O368130 Decreased fetal movements, third trimester, not applicable or unspecified: Secondary | ICD-10-CM | POA: Insufficient documentation

## 2015-08-26 DIAGNOSIS — O368390 Maternal care for abnormalities of the fetal heart rate or rhythm, unspecified trimester, not applicable or unspecified: Secondary | ICD-10-CM

## 2015-08-26 DIAGNOSIS — Z3A34 34 weeks gestation of pregnancy: Secondary | ICD-10-CM | POA: Diagnosis not present

## 2015-08-26 DIAGNOSIS — O9989 Other specified diseases and conditions complicating pregnancy, childbirth and the puerperium: Secondary | ICD-10-CM

## 2015-08-26 DIAGNOSIS — T1490 Injury, unspecified: Secondary | ICD-10-CM | POA: Diagnosis not present

## 2015-08-26 HISTORY — DX: Anemia, unspecified: D64.9

## 2015-08-26 LAB — URINALYSIS, ROUTINE W REFLEX MICROSCOPIC
Bilirubin Urine: NEGATIVE
Glucose, UA: NEGATIVE mg/dL
HGB URINE DIPSTICK: NEGATIVE
KETONES UR: NEGATIVE mg/dL
Nitrite: NEGATIVE
PROTEIN: NEGATIVE mg/dL
Specific Gravity, Urine: <1.005 — ABNORMAL LOW (ref 1.005–1.030)
pH: 7 (ref 5.0–8.0)

## 2015-08-26 LAB — URINE MICROSCOPIC-ADD ON

## 2015-08-26 LAB — GLUCOSE, CAPILLARY: Glucose-Capillary: 77 mg/dL (ref 65–99)

## 2015-08-26 NOTE — MAU Note (Signed)
PTstates that she has felt fatigued x2 weeks. Was at work [employer] and states that "she collapsed." Happened around 545pm. States that she "woke up to her co-workers helping her up." Denies vaginal bleeding or LOF. Having some lower abdominal cramping that she rates 6/10. States that she went home and drank some water to see if that helped. Worried because she is having decrease in fetal movement-last felt movement right before she came to MAU.

## 2015-08-26 NOTE — MAU Note (Signed)
Pt states for the last 2 weeks she has felt fatigued , pt states she was at work earlier and she doesn't know what happened but she "woke up" on the ground. States she did not hurt her self. Is not having decreased fetal movement and lower abd pain. Denies bleeding.

## 2015-08-26 NOTE — Discharge Instructions (Signed)
Near-Syncope Near-syncope (commonly known as near fainting) is sudden weakness, dizziness, or feeling like you might pass out. This can happen when getting up or while standing for a long time. It is caused by a sudden decrease in blood flow to the brain, which can occur for various reasons. Most of the reasons are not serious.  HOME CARE Watch your condition for any changes.  Have someone stay with you until you feel stable.  If you feel like you are going to pass out:  Lie down right away.  Prop your feet up if you can.  Breathe deeply and steadily.  Move only when the feeling has gone away. Most of the time, this feeling lasts only a few minutes. You may feel tired for several hours.  Drink enough fluids to keep your pee (urine) clear or pale yellow.  If you are taking blood pressure or heart medicine, stand up slowly.  Follow up with your doctor as told. GET HELP RIGHT AWAY IF:   You have a severe headache.  You have unusual pain in the chest, belly (abdomen), or back.  You have bleeding from the mouth or butt (rectum), or you have black or tarry poop (stool).  You feel your heart beat differently than normal, or you have a very fast pulse.  You pass out, or you twitch and shake when you pass out.  You pass out when sitting or lying down.  You feel confused.  You have trouble walking.  You are weak.  You have vision problems. MAKE SURE YOU:   Understand these instructions.  Will watch your condition.  Will get help right away if you are not doing well or get worse.   This information is not intended to replace advice given to you by your health care provider. Make sure you discuss any questions you have with your health care provider.   Document Released: 08/24/2007 Document Revised: 03/28/2014 Document Reviewed: 08/10/2012 Elsevier Interactive Patient Education 2016 Else[employer] Fetal Movement Counts Patient Name:  __________________________________________________ Patient Due Date: ____________________ Performing a fetal movement count is highly recommended in high-risk pregnancies, but it is good for every pregnant woman to do. Your health care provider may ask you to start counting fetal movements at 28 weeks of the pregnancy. Fetal movements often increase: After eating a full meal. After physical activity. After eating or drinking something sweet or cold. At rest. Pay attention to when you feel the baby is most active. This will help you notice a pattern of your baby's sleep and wake cycles and what factors contribute to an increase in fetal movement. It is important to perform a fetal movement count at the same time each day when your baby is normally most active.  HOW TO COUNT FETAL MOVEMENTS Find a quiet and comfortable area to sit or lie down on your left side. Lying on your left side provides the best blood and oxygen circulation to your baby. Write down the day and time on a sheet of paper or in a journal. Start counting kicks, flutters, swishes, rolls, or jabs in a 2-hour period. You should feel at least 10 movements within 2 hours. If you do not feel 10 movements in 2 hours, wait 2-3 hours and count again. Look for a change in the pattern or not enough counts in 2 hours. SEEK MEDICAL CARE IF: You feel less than 10 counts in 2 hours, tried twice. There is no movement in over an hour. The pattern is changing  or taking longer each day to reach 10 counts in 2 hours. You feel the baby is not moving as he or she usually does. Date: ____________ Movements: ____________ Start time: ____________ Doreatha Martin time: ____________  Date: ____________ Movements: ____________ Start time: ____________ Doreatha Martin time: ____________ Date: ____________ Movements: ____________ Start time: ____________ Doreatha Martin time: ____________ Date: ____________ Movements: ____________ Start time: ____________ Doreatha Martin time:  ____________ Date: ____________ Movements: ____________ Start time: ____________ Doreatha Martin time: ____________ Date: ____________ Movements: ____________ Start time: ____________ Doreatha Martin time: ____________ Date: ____________ Movements: ____________ Start time: ____________ Doreatha Martin time: ____________ Date: ____________ Movements: ____________ Start time: ____________ Doreatha Martin time: ____________  Date: ____________ Movements: ____________ Start time: ____________ Doreatha Martin time: ____________ Date: ____________ Movements: ____________ Start time: ____________ Doreatha Martin time: ____________ Date: ____________ Movements: ____________ Start time: ____________ Doreatha Martin time: ____________ Date: ____________ Movements: ____________ Start time: ____________ Doreatha Martin time: ____________ Date: ____________ Movements: ____________ Start time: ____________ Doreatha Martin time: ____________ Date: ____________ Movements: ____________ Start time: ____________ Doreatha Martin time: ____________ Date: ____________ Movements: ____________ Start time: ____________ Doreatha Martin time: ____________  Date: ____________ Movements: ____________ Start time: ____________ Doreatha Martin time: ____________ Date: ____________ Movements: ____________ Start time: ____________ Doreatha Martin time: ____________ Date: ____________ Movements: ____________ Start time: ____________ Doreatha Martin time: ____________ Date: ____________ Movements: ____________ Start time: ____________ Doreatha Martin time: ____________ Date: ____________ Movements: ____________ Start time: ____________ Doreatha Martin time: ____________ Date: ____________ Movements: ____________ Start time: ____________ Doreatha Martin time: ____________ Date: ____________ Movements: ____________ Start time: ____________ Doreatha Martin time: ____________  Date: ____________ Movements: ____________ Start time: ____________ Doreatha Martin time: ____________ Date: ____________ Movements: ____________ Start time: ____________ Doreatha Martin time: ____________ Date: ____________ Movements:  ____________ Start time: ____________ Doreatha Martin time: ____________ Date: ____________ Movements: ____________ Start time: ____________ Doreatha Martin time: ____________ Date: ____________ Movements: ____________ Start time: ____________ Doreatha Martin time: ____________ Date: ____________ Movements: ____________ Start time: ____________ Doreatha Martin time: ____________ Date: ____________ Movements: ____________ Start time: ____________ Doreatha Martin time: ____________  Date: ____________ Movements: ____________ Start time: ____________ Doreatha Martin time: ____________ Date: ____________ Movements: ____________ Start time: ____________ Doreatha Martin time: ____________ Date: ____________ Movements: ____________ Start time: ____________ Doreatha Martin time: ____________ Date: ____________ Movements: ____________ Start time: ____________ Doreatha Martin time: ____________ Date: ____________ Movements: ____________ Start time: ____________ Doreatha Martin time: ____________ Date: ____________ Movements: ____________ Start time: ____________ Doreatha Martin time: ____________ Date: ____________ Movements: ____________ Start time: ____________ Doreatha Martin time: ____________  Date: ____________ Movements: ____________ Start time: ____________ Doreatha Martin time: ____________ Date: ____________ Movements: ____________ Start time: ____________ Doreatha Martin time: ____________ Date: ____________ Movements: ____________ Start time: ____________ Doreatha Martin time: ____________ Date: ____________ Movements: ____________ Start time: ____________ Doreatha Martin time: ____________ Date: ____________ Movements: ____________ Start time: ____________ Doreatha Martin time: ____________ Date: ____________ Movements: ____________ Start time: ____________ Doreatha Martin time: ____________ Date: ____________ Movements: ____________ Start time: ____________ Doreatha Martin time: ____________  Date: ____________ Movements: ____________ Start time: ____________ Doreatha Martin time: ____________ Date: ____________ Movements: ____________ Start time: ____________ Doreatha Martin  time: ____________ Date: ____________ Movements: ____________ Start time: ____________ Doreatha Martin time: ____________ Date: ____________ Movements: ____________ Start time: ____________ Doreatha Martin time: ____________ Date: ____________ Movements: ____________ Start time: ____________ Doreatha Martin time: ____________ Date: ____________ Movements: ____________ Start time: ____________ Doreatha Martin time: ____________ Date: ____________ Movements: ____________ Start time: ____________ Doreatha Martin time: ____________  Date: ____________ Movements: ____________ Start time: ____________ Doreatha Martin time: ____________ Date: ____________ Movements: ____________ Start time: ____________ Doreatha Martin time: ____________ Date: ____________ Movements: ____________ Start time: ____________ Doreatha Martin time: ____________ Date: ____________ Movements: ____________ Start time: ____________ Doreatha Martin time: ____________ Date: ____________ Movements: ____________ Start time: ____________ Doreatha Martin time: ____________ Date: ____________ Movements: ____________ Start time: ____________ Doreatha Martin time: ____________   This information  is not intended to replace advice given to you by your health care provider. Make sure you discuss any questions you have with your health care provider.   Document Released: 04/06/2006 Document Revised: 03/28/2014 Document Reviewed: 01/02/2012 Elsevier Interactive Patient Education [employer]

## 2015-08-26 NOTE — MAU Provider Note (Signed)
  History     CSN: [id number]  Arrival date and time: 08/26/15 2055   First Provider Initiated Contact with Patient 08/26/15 2215      No chief complaint on file.  HPI Ms Morgan Howell is a 35yo [id number] @ 34.0wks who presents for eval of decreased FM. Also had abd cramping earlier but has mostly resolved now. No leaking or bldg.  Earlier today she reports her knees buckling and falling down- no abd or head trauma. States she never lost consciousness but felt slightly 'dazed'. Has felt very fatigued x past few weeks; hgb recently dx @ 8mg /dl and started on po FeSO4. [facility] and has been essentially unremarkable other than the anemia.  OB History    Gravida Para Term Preterm AB TAB SAB Ectopic Multiple Living   3 2 2       2       Past Medical History  Diagnosis Date  . Medical history non-contributory   . Anemia     Past Surgical History  Procedure Laterality Date  . No past surgeries      History reviewed. No pertinent family history.  Social History  Substance Use Topics  . Smoking status: Never Smoker   . Smokeless tobacco: Never Used  . Alcohol Use: No    Allergies:  Allergies  Allergen Reactions  . Strawberry Extract Anaphylaxis    Prescriptions prior to admission  Medication Sig Dispense Refill Last Dose  . ferrous sulfate 325 (65 FE) MG tablet Take 325 mg by mouth daily with breakfast.   08/26/2015 at Unknown time  . Prenatal Vit-Fe Fumarate-FA (PRENATAL MULTIVITAMIN) TABS tablet Take 1 tablet by mouth daily at 12 noon.   08/26/2015 at Unknown time    ROS Physical Exam   Blood pressure 109/76, pulse 81, temperature 98.4 F (36.9 C), temperature source Oral, resp. rate 17, height 5' (1.524 m), weight 75.297 kg (166 lb), SpO2 100 %.  Physical Exam  Constitutional: She is oriented to person, place, and time. She appears well-developed.  HENT:  Head: Normocephalic.  Neck: Normal range of motion.  Cardiovascular: Normal rate.    Respiratory: Effort normal.  GI:  EFM 135, some accels, variable to nadir of 115 x 3 mins, spont resolved w/ accels afterwards No ctx per toco  Musculoskeletal: Normal range of motion.  MAE x 4  Neurological: She is alert and oriented to person, place, and time.  Skin: Skin is warm and dry.  Psychiatric: She has a normal mood and affect. Her behavior is normal. Thought content normal.   Urinalysis    Component Value Date/Time   COLORURINE YELLOW 08/26/2015 2105   APPEARANCEUR CLEAR 08/26/2015 2105   LABSPEC <1.005* 08/26/2015 2105   PHURINE 7.0 08/26/2015 2105   GLUCOSEU NEGATIVE 08/26/2015 2105   HGBUR NEGATIVE 08/26/2015 2105   BILIRUBINUR NEGATIVE 08/26/2015 2105   KETONESUR NEGATIVE 08/26/2015 2105   PROTEINUR NEGATIVE 08/26/2015 2105   UROBILINOGEN >8.0* 12/18/2014 0355   NITRITE NEGATIVE 08/26/2015 2105   LEUKOCYTESUR TRACE* 08/26/2015 2105   Micro: SE 0-5, rare bacteria  CBG: 77mg /dl  BPP: 8/8, AFI 62.95MW15.17cm   MAU Course  Procedures  MDM CBG/UA/BPP ordered  Assessment and Plan  IUP@ 34.0wks Near syncope Decreased FM  D/C home w/ fetal kick counts Continue to eat iron-rich foods and take iron supplement F/U as scheduled at next OB appt on Wed  SHAW, Cala BradfordKIMBERLY CNM 08/26/2015, 10:35 PM

## 2015-09-03 LAB — OB RESULTS CONSOLE GBS: GBS: POSITIVE

## 2015-09-30 ENCOUNTER — Inpatient Hospital Stay (HOSPITAL_COMMUNITY)
Admission: AD | Admit: 2015-09-30 | Discharge: 2015-10-02 | DRG: 775 | Disposition: A | Discharge status: Home / Self Care | Payer: Medicaid Other | Source: Ambulatory Visit | Attending: Obstetrics & Gynecology | Admitting: Obstetrics & Gynecology

## 2015-09-30 ENCOUNTER — Encounter (HOSPITAL_COMMUNITY): Payer: Self-pay

## 2015-09-30 DIAGNOSIS — O9902 Anemia complicating childbirth: Secondary | ICD-10-CM | POA: Diagnosis present

## 2015-09-30 DIAGNOSIS — Z3A36 36 weeks gestation of pregnancy: Secondary | ICD-10-CM | POA: Diagnosis not present

## 2015-09-30 DIAGNOSIS — O1415 Severe pre-eclampsia, complicating the puerperium: Secondary | ICD-10-CM | POA: Diagnosis not present

## 2015-09-30 DIAGNOSIS — O1493 Unspecified pre-eclampsia, third trimester: Secondary | ICD-10-CM

## 2015-09-30 DIAGNOSIS — Z9889 Other specified postprocedural states: Secondary | ICD-10-CM

## 2015-09-30 DIAGNOSIS — Z3A39 39 weeks gestation of pregnancy: Secondary | ICD-10-CM

## 2015-09-30 DIAGNOSIS — O1494 Unspecified pre-eclampsia, complicating childbirth: Secondary | ICD-10-CM | POA: Diagnosis not present

## 2015-09-30 DIAGNOSIS — O99824 Streptococcus B carrier state complicating childbirth: Secondary | ICD-10-CM | POA: Diagnosis present

## 2015-09-30 DIAGNOSIS — O1404 Mild to moderate pre-eclampsia, complicating childbirth: Secondary | ICD-10-CM | POA: Diagnosis not present

## 2015-09-30 DIAGNOSIS — D573 Sickle-cell trait: Secondary | ICD-10-CM | POA: Diagnosis present

## 2015-09-30 DIAGNOSIS — R030 Elevated blood-pressure reading, without diagnosis of hypertension: Secondary | ICD-10-CM | POA: Diagnosis not present

## 2015-09-30 DIAGNOSIS — O1490 Unspecified pre-eclampsia, unspecified trimester: Secondary | ICD-10-CM | POA: Diagnosis present

## 2015-09-30 LAB — COMPREHENSIVE METABOLIC PANEL
ALBUMIN: 2.8 g/dL — AB (ref 3.5–5.0)
ALT: 9 U/L — ABNORMAL LOW (ref 14–54)
ANION GAP: 9 (ref 5–15)
AST: 17 U/L (ref 15–41)
Alkaline Phosphatase: 158 U/L — ABNORMAL HIGH (ref 38–126)
BUN: <5 mg/dL — ABNORMAL LOW (ref 6–20)
CALCIUM: 8.4 mg/dL — AB (ref 8.9–10.3)
CO2: 19 mmol/L — AB (ref 22–32)
Chloride: 110 mmol/L (ref 101–111)
Creatinine, Ser: 0.65 mg/dL (ref 0.44–1.00)
GFR calc non Af Amer: >60 mL/min (ref >60)
GLUCOSE: 72 mg/dL (ref 65–99)
POTASSIUM: 3.4 mmol/L — AB (ref 3.5–5.1)
SODIUM: 138 mmol/L (ref 135–145)
TOTAL PROTEIN: 5.7 g/dL — AB (ref 6.5–8.1)
Total Bilirubin: 1.8 mg/dL — ABNORMAL HIGH (ref 0.3–1.2)

## 2015-09-30 LAB — TYPE AND SCREEN
ABO/RH(D): O POS
Antibody Screen: NEGATIVE

## 2015-09-30 LAB — CBC
HCT: 29.4 % — ABNORMAL LOW (ref 36.0–46.0)
Hemoglobin: 9.5 g/dL — ABNORMAL LOW (ref 12.0–15.0)
MCH: 21.3 pg — ABNORMAL LOW (ref 26.0–34.0)
MCHC: 32.3 g/dL (ref 30.0–36.0)
MCV: 66.1 fL — ABNORMAL LOW (ref 78.0–100.0)
Platelets: 179 10*3/uL (ref 150–400)
RBC: 4.45 MIL/uL (ref 3.87–5.11)
RDW: 19.4 % — AB (ref 11.5–15.5)
WBC: 6.1 10*3/uL (ref 4.0–10.5)

## 2015-09-30 LAB — ABO/RH: ABO/RH(D): O POS

## 2015-09-30 LAB — PROTEIN / CREATININE RATIO, URINE
Creatinine, Urine: 213 mg/dL
PROTEIN CREATININE RATIO: 0.36 mg/mg{creat} — AB (ref 0.00–0.15)
TOTAL PROTEIN, URINE: 76 mg/dL

## 2015-09-30 MED ORDER — LIDOCAINE HCL (PF) 1 % IJ SOLN
30.0000 mL | INTRAMUSCULAR | Status: DC | PRN
  Administered 2015-10-01: 30 mL via SUBCUTANEOUS
  Filled 2015-09-30: qty 30

## 2015-09-30 MED ORDER — LACTATED RINGERS IV SOLN
INTRAVENOUS | Status: DC
  Administered 2015-09-30 – 2015-10-01 (×3): via INTRAVENOUS

## 2015-09-30 MED ORDER — SOD CITRATE-CITRIC ACID 500-334 MG/5ML PO SOLN: 30.0000 mL | ORAL | Status: DC | PRN

## 2015-09-30 MED ORDER — OXYTOCIN 40 UNITS IN LACTATED RINGERS INFUSION - SIMPLE MED
2.5000 [IU]/h | INTRAVENOUS | Status: DC
  Administered 2015-10-01: 2.5 [IU]/h via INTRAVENOUS
  Filled 2015-09-30: qty 1000

## 2015-09-30 MED ORDER — OXYCODONE-ACETAMINOPHEN 5-325 MG PO TABS
2.0000 | ORAL_TABLET | ORAL | Status: DC | PRN
Start: 2015-09-30 — End: 2015-10-01

## 2015-09-30 MED ORDER — OXYTOCIN BOLUS FROM INFUSION
500.0000 mL | INTRAVENOUS | Status: DC
  Administered 2015-10-01: 10:00:00 via INTRAVENOUS

## 2015-09-30 MED ORDER — LACTATED RINGERS IV SOLN
500.0000 mL | INTRAVENOUS | Status: DC | PRN
  Administered 2015-09-30: 1000 mL via INTRAVENOUS

## 2015-09-30 MED ORDER — ACETAMINOPHEN 325 MG PO TABS: 650.0000 mg | ORAL_TABLET | ORAL | Status: DC | PRN

## 2015-09-30 MED ORDER — DEXTROSE 5 % IV SOLN
5.0000 10*6.[IU] | Freq: Once | INTRAVENOUS | Status: AC
  Administered 2015-10-01: 5 10*6.[IU] via INTRAVENOUS
  Filled 2015-09-30: qty 5

## 2015-09-30 MED ORDER — OXYCODONE-ACETAMINOPHEN 5-325 MG PO TABS: 1.0000 | ORAL_TABLET | ORAL | Status: DC | PRN

## 2015-09-30 MED ORDER — PENICILLIN G POTASSIUM 5000000 UNITS IJ SOLR
2.5000 10*6.[IU] | INTRAVENOUS | Status: DC
  Administered 2015-10-01: 2.5 10*6.[IU] via INTRAVENOUS
  Filled 2015-09-30 (×4): qty 2.5

## 2015-09-30 MED ORDER — ONDANSETRON HCL 4 MG/2ML IJ SOLN
4.0000 mg | Freq: Four times a day (QID) | INTRAMUSCULAR | Status: DC | PRN
  Administered 2015-10-01: 4 mg via INTRAVENOUS
  Filled 2015-09-30: qty 2

## 2015-09-30 NOTE — H&P (Signed)
Morgan Howell is a 35 y.o. female presenting for evaluation of elevated BP.  Denies headache or visual changes. States has some increase in swelling. .  RN Note: Pt sent from [facility] with elevated BP, pt denies HA, visual changes or epigastric pain. Having intermittent uc's, denies bleeding or LOF.  Maternal Medical History:  Reason for admission: Nausea. Hypertension   Contractions: Frequency: irregular.   Perceived severity is mild.    Fetal activity: Perceived fetal activity is normal.   Last perceived fetal movement was within the past hour.    Prenatal complications: PIH and pre-eclampsia.   No bleeding, oligohydramnios, placental abnormality, polyhydramnios or preterm labor.   Prenatal Complications - Diabetes: none.    OB History    Gravida Para Term Preterm AB TAB SAB Ectopic Multiple Living   Past Medical History  Diagnosis Date  . Medical history non-contributory   . Anemia    Past Surgical History  Procedure Laterality Date  . No past surgeries     Family History: family history is not on file. Social History:  reports that she has never smoked. She has never used smokeless tobacco. She reports that she does not drink alcohol or use illicit drugs.   Prenatal Transfer Tool  Maternal Diabetes: No Genetic Screening: Normal Maternal Ultrasounds/Referrals: Normal Fetal Ultrasounds or other Referrals:  None Maternal Substance Abuse:  No Significant Maternal Medications:  None Significant Maternal Lab Results:  None GBS POSITIVE Other Comments:  Preeclampsia  Review of Systems  Constitutional: Negative for fever, chills and malaise/fatigue.  Eyes: Negative for blurred vision and double vision.  Gastrointestinal: Negative for nausea, vomiting, abdominal pain, diarrhea and constipation.  Neurological: Negative for dizziness, sensory change, speech change, focal weakness, seizures, loss of consciousness and headaches.      Blood pressure  133/97, pulse 77, temperature 98.3 F (36.8 C), temperature source Oral, resp. rate 18. Maternal Exam:  Uterine Assessment: Contraction strength is mild.  Contraction frequency is irregular.   Abdomen: Patient reports no abdominal tenderness. Fundal height is 38.   Estimated fetal weight is 7.   Fetal presentation: vertex  Introitus: Normal vulva. Normal vagina.  Vagina is negative for discharge.  Ferning test: not done.  Nitrazine test: not done. Amniotic fluid character: not assessed.  Pelvis: adequate for delivery.   Cervix: Cervix evaluated by digital exam.     Fetal Exam Fetal Monitor Review: Mode: ultrasound.   Baseline rate: 135.  Variability: moderate (6-25 bpm).   Pattern: accelerations present and no decelerations.    Fetal State Assessment: Category I - tracings are normal.     Physical Exam  Constitutional: She is oriented to person, place, and time. She appears well-developed and well-nourished. No distress.  HENT:  Head: Normocephalic.  Cardiovascular: Normal rate, regular rhythm and normal heart sounds.  Exam reveals no gallop and no friction rub.   No murmur heard. Respiratory: Effort normal and breath sounds normal. No respiratory distress. She has no wheezes. She has no rales.  GI: Soft. She exhibits no distension. There is no tenderness. There is no rebound and no guarding.  Genitourinary: Vagina normal. No vaginal discharge found.  Dilation: 2 Effacement (%): 80 Cervical Position: Posterior Station: -2, -1 Presentation: Vertex Exam by:: Joanann Mies CNM   Musculoskeletal: Normal range of motion. She exhibits edema (trace).  Neurological: She is alert and oriented to person, place, and time. She has normal reflexes. She displays  normal reflexes. She exhibits normal muscle tone.  Skin: Skin is warm and dry.  Psychiatric: She has a normal mood and affect.    Prenatal labs: ABO, Rh:   Antibody:   Rubella:   RPR:    HBsAg:    HIV:    GBS:     Results for orders placed or performed during the hospital encounter of 09/30/15 (from the past 24 hour(s))  Protein / creatinine ratio, urine     Status: Abnormal   Collection Time: 09/30/15  2:50 PM  Result Value Ref Range   Creatinine, Urine 213.00 mg/dL   Total Protein, Urine 76 mg/dL   Protein Creatinine Ratio 0.36 (H) 0.00 - 0.15 mg/mg[Cre]    Assessment/Plan: SIUP at 3762w0d  Preeclampsia with NO severe features Favorable cervix  [facility] consult Dr Debroah LoopArnold Routine orders Induction of labor, probably Pitocin  PCN when active  [facility]aksWILLIAMS,Jaxon Mynhier 09/30/2015, 3:57 PM

## 2015-09-30 NOTE — MAU Note (Signed)
[facility], pt denies HA, visual changes or epigastric pain.  Having intermittent uc's, denies bleeding or LOF.

## 2015-10-01 ENCOUNTER — Encounter (HOSPITAL_COMMUNITY): Payer: Self-pay | Admitting: *Deleted

## 2015-10-01 DIAGNOSIS — Z3A39 39 weeks gestation of pregnancy: Secondary | ICD-10-CM

## 2015-10-01 DIAGNOSIS — O1494 Unspecified pre-eclampsia, complicating childbirth: Secondary | ICD-10-CM

## 2015-10-01 LAB — RPR: RPR Ser Ql: NONREACTIVE

## 2015-10-01 MED ORDER — OXYTOCIN 40 UNITS IN LACTATED RINGERS INFUSION - SIMPLE MED
1.0000 m[IU]/min | INTRAVENOUS | Status: DC
  Administered 2015-10-01: 2 m[IU]/min via INTRAVENOUS

## 2015-10-01 MED ORDER — DIBUCAINE 1 % RE OINT: 1.0000 "application " | TOPICAL_OINTMENT | RECTAL | Status: DC | PRN

## 2015-10-01 MED ORDER — WITCH HAZEL-GLYCERIN EX PADS: 1.0000 "application " | MEDICATED_PAD | CUTANEOUS | Status: DC | PRN

## 2015-10-01 MED ORDER — ONDANSETRON HCL 4 MG PO TABS: 4.0000 mg | ORAL_TABLET | ORAL | Status: DC | PRN

## 2015-10-01 MED ORDER — ZOLPIDEM TARTRATE 5 MG PO TABS: 5.0000 mg | ORAL_TABLET | Freq: Every evening | ORAL | Status: DC | PRN

## 2015-10-01 MED ORDER — TETANUS-DIPHTH-ACELL PERTUSSIS 5-2.5-18.5 LF-MCG/0.5 IM SUSP: 0.5000 mL | Freq: Once | INTRAMUSCULAR | Status: DC

## 2015-10-01 MED ORDER — PRENATAL MULTIVITAMIN CH: 1.0000 | ORAL_TABLET | Freq: Every day | ORAL | Status: DC

## 2015-10-01 MED ORDER — DIPHENHYDRAMINE HCL 25 MG PO CAPS: 25.0000 mg | ORAL_CAPSULE | Freq: Four times a day (QID) | ORAL | Status: DC | PRN

## 2015-10-01 MED ORDER — BENZOCAINE-MENTHOL 20-0.5 % EX AERO
1.0000 | INHALATION_SPRAY | CUTANEOUS | Status: DC | PRN
Start: 2015-10-01 — End: 2015-10-02

## 2015-10-01 MED ORDER — SIMETHICONE 80 MG PO CHEW
80.0000 mg | CHEWABLE_TABLET | ORAL | Status: DC | PRN
Start: 2015-10-01 — End: 2015-10-02

## 2015-10-01 MED ORDER — IBUPROFEN 600 MG PO TABS
600.0000 mg | ORAL_TABLET | Freq: Four times a day (QID) | ORAL | Status: DC
  Filled 2015-10-01: qty 1

## 2015-10-01 MED ORDER — COCONUT OIL OIL: 1.0000 "application " | TOPICAL_OIL | Status: DC | PRN

## 2015-10-01 MED ORDER — ACETAMINOPHEN 325 MG PO TABS: 650.0000 mg | ORAL_TABLET | ORAL | Status: DC | PRN

## 2015-10-01 MED ORDER — TERBUTALINE SULFATE 1 MG/ML IJ SOLN: 0.2500 mg | Freq: Once | INTRAMUSCULAR | Status: DC | PRN

## 2015-10-01 MED ORDER — FENTANYL CITRATE (PF) 100 MCG/2ML IJ SOLN
100.0000 ug | INTRAMUSCULAR | Status: DC | PRN
  Administered 2015-10-01: 100 ug via INTRAVENOUS
  Filled 2015-10-01 (×2): qty 2

## 2015-10-01 MED ORDER — SENNOSIDES-DOCUSATE SODIUM 8.6-50 MG PO TABS
2.0000 | ORAL_TABLET | ORAL | Status: DC
  Filled 2015-10-01: qty 2

## 2015-10-01 MED ORDER — ONDANSETRON HCL 4 MG/2ML IJ SOLN
4.0000 mg | INTRAMUSCULAR | Status: DC | PRN
Start: 2015-10-01 — End: 2015-10-02

## 2015-10-01 NOTE — Anesthesia Pain Management Evaluation Note (Signed)
  CRNA Pain Management Visit Note  Patient: Morgan Howell, 35 y.o., female  "Hello I am a member of the anesthesia team at [facility]. We have an anesthesia team available at all times to provide care throughout the hospital, including epidural management and anesthesia for C-section. I don't know your plan for the delivery whether it a natural birth, water birth, IV sedation, nitrous supplementation, doula or epidural, but we want to meet your pain goals."   1.Was your pain managed to your expectations on prior hospitalizations?   Yes   2.What is your expectation for pain management during this hospitalization?     IV pain meds and Nitrous Oxide  3.How can we help you reach that goal?  Iv pain, N2O  Record the patient's initial score and the patient's pain goal.   Pain: 5  Pain Goal: 8 [facility] wants you to be able to say your pain was always managed very well.  Morgan Howell 10/01/2015

## 2015-10-02 MED ORDER — IBUPROFEN 600 MG PO TABS: 600.0000 mg | ORAL_TABLET | Freq: Four times a day (QID) | ORAL | Status: DC

## 2015-10-02 MED ORDER — IBUPROFEN 100 MG/5ML PO SUSP
600.0000 mg | Freq: Four times a day (QID) | ORAL | Status: DC
  Administered 2015-10-02: 600 mg via ORAL
  Filled 2015-10-02 (×3): qty 30

## 2015-10-02 MED ORDER — HYDROCHLOROTHIAZIDE 12.5 MG PO TABS: 12.5000 mg | ORAL_TABLET | Freq: Every day | ORAL | Status: DC

## 2015-10-02 NOTE — Discharge Summary (Signed)
OB Discharge Summary     Patient Name: Morgan Howell DOB: Nov 01, 1980 MRN: [id number]  Date of admission: 09/30/2015 Delivering MD: Garth Bigness   Date of discharge: 10/02/2015  Admitting diagnosis: 39WKS, INCREASED BP Intrauterine pregnancy: [redacted]w[redacted]d     Secondary diagnosis:  Active Problems:   H/O LEEP   Sickle cell trait (HCC)   Preeclampsia   NSVD (normal spontaneous vaginal delivery)  Additional problems: none     Discharge diagnosis: Term Pregnancy Delivered and Preeclampsia (mild)                                                                                                Post partum procedures:none  Augmentation: none  Complications: None  Hospital course:    36 y.o. yo G3P2002 at [redacted]w[redacted]d was admitted for preeclampsia at term without severe features  on 09/30/2015. Cx was favorable and she underwent pitocin induction of labor. Patient had an uncomplicated labor course as follows:  Membrane Rupture Time/Date: 10:14 AM ,10/01/2015   Intrapartum Procedures: Episiotomy: None [1]                                         Lacerations:  1st degree [2];Periurethral [8]  Patient had a delivery of a Viable infant. 10/01/2015  Information for the patient's newborn:  Cardelia, Sassano [[id number]]  Delivery Method: Vaginal, Spontaneous Delivery (Filed from Delivery Summary)    Pateint had an uncomplicated postpartum course.  She is ambulating, tolerating a regular diet, passing flatus, and urinating well. On day of discharge, no symptoms of preeclampsia and had elevated SBP upper 150's x 3 overnight. D/W Dr. Debroah Loop re: D/C on HCTZ Patient is discharged home in stable condition on 10/02/2015.    Physical exam  Filed Vitals:   10/01/15 1230 10/01/15 1330 10/01/15 1700 10/02/15 0542  BP: 145/80 154/85 144/81 135/91  Pulse: 55 52 57 57  Temp: 98.2 F (36.8 C) 98.5 F (36.9 C) 98.5 F (36.9 C) 98.4 F (36.9 C)  TempSrc: Oral Oral Oral Oral  Resp: Height:       Weight:      SpO2:       General: alert, cooperative and no distress Lochia: appropriate Uterine Fundus: firm, involuted well  Incision: N/A DVT Evaluation: No evidence of DVT seen on physical exam. Labs: Lab Results  Component Value Date   WBC 6.1 09/30/2015   HGB 9.5* 09/30/2015   HCT 29.4* 09/30/2015   MCV 66.1* 09/30/2015   PLT 179 09/30/2015   CMP Latest Ref Rng 09/30/2015  Glucose 65 - 99 mg/dL 72  BUN 6 - 20 mg/dL <7(W)  Creatinine 2.95 - 1.00 mg/dL 6.21  Sodium 308 - 657 mmol/L 138  Potassium 3.5 - 5.1 mmol/L 3.4(L)  Chloride 101 - 111 mmol/L 110  CO2 22 - 32 mmol/L 19(L)  Calcium 8.9 - 10.3 mg/dL 8.4(O)  Total Protein 6.5 - 8.1 g/dL 9.6(E)  Total Bilirubin 0.3 - 1.2 mg/dL 9.5(M)  Alkaline Phos  38 - 126 U/L 158(H)  AST 15 - 41 U/L 17  ALT 14 - 54 U/L 9(L)    Discharge instruction: per After Visit Summary and "Baby and Me Booklet".  After visit meds:    Medication List    STOP taking these medications        ferrous sulfate 325 (65 FE) MG tablet      TAKE these medications        hydrochlorothiazide 12.5 MG tablet  Commonly known as:  HYDRODIURIL  Take 1 tablet (12.5 mg total) by mouth daily.     ibuprofen 600 MG tablet  Commonly known as:  ADVIL,MOTRIN  Take 1 tablet (600 mg total) by mouth every 6 (six) hours.     prenatal multivitamin Tabs tablet  Take 1 tablet by mouth daily at 12 noon.        Diet: routine diet  Activity: Advance as tolerated. Pelvic rest for 6 weeks.   [facility] 2 days for Home BP check Follow up Appt:No future appointments. Follow up Visit:No Follow-up on file.  Postpartum contraception: Depo Provera  Newborn Data: Live born female  Birth Weight: 6 lb 13.5 oz (3105 g) APGAR: 8, 9  Baby Feeding: Bottle Disposition:home with mother   10/02/2015 Ziquan Fidel, CNM

## 2015-10-02 NOTE — Progress Notes (Signed)
Post Partum Day 1  Subjective:  Morgan Howell is a 35 y.o. G3P3001 6263w1d s/p SVD IOL for pre-e without severe features.  No acute events overnight.  Pt denies problems with ambulating, voiding or po intake.  She denies nausea or vomiting.  Pain is well controlled.  She has had flatus. She has not had bowel movement.  Lochia Small. Denies HA, changes in vision.  Plan for birth control is Depo-Provera.  Method of Feeding: bottle  Objective: BP 135/91 mmHg  Pulse 57  Temp(Src) 98.4 F (36.9 C) (Oral)  Resp 19  Ht 5' (1.524 m)  Wt 77.973 kg (171 lb 14.4 oz)  BMI 33.57 kg/m2  SpO2 100%  Breastfeeding? Unknown  Physical Exam:  General: alert, cooperative and no distress Lochia:normal flow Chest: CTAB Heart: RRR no m/r/g Abdomen: +BS, soft, nontender, fundus firm at/below umbilicus Uterine Fundus: firm DVT Evaluation: No evidence of DVT seen on physical exam. Extremities: no edema   Recent Labs  09/30/15 1623  HGB 9.5*  HCT 29.4*    Assessment/Plan:  ASSESSMENT: Morgan Howell is a 35 y.o. G3P3001 5963w1d ppd #1 s/p NSVD doing well.   Patient doing well overnight. Discharge tomorrow.    LOS: 2 days    Bayard HuggerLauren J Ord 10/02/2015, 7:32 AM   Evaluation and management procedures were performed by Medical student under my supervision/collaboration. Chart reviewed, patient examined by me and I agree with management and plan. See my 10/02/15 Discharge Summary for documentation. Danae Orleanseirdre C Eva Vallee, CNM 10/02/2015 9:15 AM

## 2015-10-02 NOTE — Discharge Instructions (Signed)

## 2015-10-03 DIAGNOSIS — O99824 Streptococcus B carrier state complicating childbirth: Secondary | ICD-10-CM | POA: Diagnosis not present

## 2015-10-03 DIAGNOSIS — Z3A36 36 weeks gestation of pregnancy: Secondary | ICD-10-CM | POA: Diagnosis not present

## 2015-10-06 ENCOUNTER — Inpatient Hospital Stay (HOSPITAL_COMMUNITY)
Admission: AD | Admit: 2015-10-06 | Discharge: 2015-10-08 | DRG: 776 | Disposition: A | Discharge status: Home / Self Care | Payer: BLUE CROSS/BLUE SHIELD | Source: Ambulatory Visit | Attending: Obstetrics & Gynecology | Admitting: Obstetrics & Gynecology

## 2015-10-06 ENCOUNTER — Encounter (HOSPITAL_COMMUNITY): Payer: Self-pay | Admitting: *Deleted

## 2015-10-06 DIAGNOSIS — R030 Elevated blood-pressure reading, without diagnosis of hypertension: Secondary | ICD-10-CM | POA: Diagnosis not present

## 2015-10-06 DIAGNOSIS — O1415 Severe pre-eclampsia, complicating the puerperium: Secondary | ICD-10-CM | POA: Diagnosis not present

## 2015-10-06 LAB — URINALYSIS, ROUTINE W REFLEX MICROSCOPIC
GLUCOSE, UA: NEGATIVE mg/dL
Ketones, ur: 40 mg/dL — AB
Nitrite: NEGATIVE
PROTEIN: 30 mg/dL — AB
Specific Gravity, Urine: 1.01 (ref 1.005–1.030)
pH: 7 (ref 5.0–8.0)

## 2015-10-06 LAB — CBC
HCT: 27.9 % — ABNORMAL LOW (ref 36.0–46.0)
Hemoglobin: 9 g/dL — ABNORMAL LOW (ref 12.0–15.0)
MCH: 21.4 pg — AB (ref 26.0–34.0)
MCHC: 32.3 g/dL (ref 30.0–36.0)
MCV: 66.3 fL — AB (ref 78.0–100.0)
PLATELETS: 252 10*3/uL (ref 150–400)
RBC: 4.21 MIL/uL (ref 3.87–5.11)
RDW: 19.5 % — AB (ref 11.5–15.5)
WBC: 5.7 10*3/uL (ref 4.0–10.5)

## 2015-10-06 LAB — COMPREHENSIVE METABOLIC PANEL
ALK PHOS: 116 U/L (ref 38–126)
ALT: 17 U/L (ref 14–54)
ANION GAP: 10 (ref 5–15)
AST: 21 U/L (ref 15–41)
Albumin: 3.3 g/dL — ABNORMAL LOW (ref 3.5–5.0)
BUN: 6 mg/dL (ref 6–20)
CALCIUM: 8.6 mg/dL — AB (ref 8.9–10.3)
CHLORIDE: 104 mmol/L (ref 101–111)
CO2: 24 mmol/L (ref 22–32)
CREATININE: 0.64 mg/dL (ref 0.44–1.00)
Glucose, Bld: 89 mg/dL (ref 65–99)
Potassium: 3.5 mmol/L (ref 3.5–5.1)
SODIUM: 138 mmol/L (ref 135–145)
Total Bilirubin: 2.1 mg/dL — ABNORMAL HIGH (ref 0.3–1.2)
Total Protein: 6.9 g/dL (ref 6.5–8.1)

## 2015-10-06 LAB — PROTEIN / CREATININE RATIO, URINE
CREATININE, URINE: 185 mg/dL
Protein Creatinine Ratio: 0.4 mg/mg{Cre} — ABNORMAL HIGH (ref 0.00–0.15)
TOTAL PROTEIN, URINE: 74 mg/dL

## 2015-10-06 LAB — URINE MICROSCOPIC-ADD ON

## 2015-10-06 MED ORDER — AMLODIPINE BESYLATE 10 MG PO TABS
10.0000 mg | ORAL_TABLET | Freq: Every day | ORAL | Status: DC
  Administered 2015-10-07 – 2015-10-08 (×2): 10 mg via ORAL
  Filled 2015-10-06 (×3): qty 1

## 2015-10-06 MED ORDER — PRENATAL MULTIVITAMIN CH: 1.0000 | ORAL_TABLET | Freq: Every day | ORAL | Status: DC

## 2015-10-06 MED ORDER — HYDRALAZINE HCL 20 MG/ML IJ SOLN: 10.0000 mg | Freq: Once | INTRAMUSCULAR | Status: DC | PRN

## 2015-10-06 MED ORDER — MAGNESIUM SULFATE BOLUS VIA INFUSION
4.0000 g | Freq: Once | INTRAVENOUS | Status: AC
  Administered 2015-10-06: 4 g via INTRAVENOUS
  Filled 2015-10-06: qty 500

## 2015-10-06 MED ORDER — ACETAMINOPHEN 160 MG/5ML PO SOLN
650.0000 mg | ORAL | Status: DC | PRN
  Administered 2015-10-07: 650 mg via ORAL
  Filled 2015-10-06: qty 20.3

## 2015-10-06 MED ORDER — OXYCODONE HCL 5 MG/5ML PO SOLN
5.0000 mg | ORAL | Status: DC | PRN
  Filled 2015-10-06: qty 5

## 2015-10-06 MED ORDER — MAGNESIUM SULFATE 50 % IJ SOLN
2.0000 g/h | INTRAVENOUS | Status: AC
  Administered 2015-10-07: 2 g/h via INTRAVENOUS
  Filled 2015-10-06 (×2): qty 80

## 2015-10-06 MED ORDER — BUTALBITAL-APAP-CAFFEINE 50-325-40 MG PO TABS: 2.0000 | ORAL_TABLET | Freq: Once | ORAL | Status: DC

## 2015-10-06 MED ORDER — AMLODIPINE BESYLATE 5 MG PO TABS
5.0000 mg | ORAL_TABLET | Freq: Once | ORAL | Status: AC
  Administered 2015-10-06: 5 mg via ORAL
  Filled 2015-10-06: qty 1

## 2015-10-06 MED ORDER — LACTATED RINGERS IV SOLN
INTRAVENOUS | Status: DC
  Administered 2015-10-06 – 2015-10-07 (×2): via INTRAVENOUS

## 2015-10-06 MED ORDER — OXYCODONE HCL 5 MG/5ML PO SOLN: 10.0000 mg | ORAL | Status: DC | PRN

## 2015-10-06 MED ORDER — HYDROCHLOROTHIAZIDE 25 MG PO TABS
25.0000 mg | ORAL_TABLET | Freq: Every day | ORAL | Status: DC
  Administered 2015-10-07 – 2015-10-08 (×2): 25 mg via ORAL
  Filled 2015-10-06 (×3): qty 1

## 2015-10-06 MED ORDER — HYDROCHLOROTHIAZIDE 25 MG PO TABS
25.0000 mg | ORAL_TABLET | Freq: Once | ORAL | Status: AC
  Administered 2015-10-06: 25 mg via ORAL
  Filled 2015-10-06: qty 1

## 2015-10-06 MED ORDER — LABETALOL HCL 5 MG/ML IV SOLN
20.0000 mg | INTRAVENOUS | Status: DC | PRN
  Administered 2015-10-06: 20 mg via INTRAVENOUS
  Filled 2015-10-06: qty 4

## 2015-10-06 NOTE — MAU Provider Note (Signed)
Chief Complaint: Hypertension  First Provider Initiated Contact with Patient 10/06/15 1656      SUBJECTIVE HPI: Morgan Howell is a 35 y.o. G3P3001 at 5 days status post spontaneous vaginal delivery on 10/01/2015 who presents to Maternity Admissions reporting elevated blood pressure per home health nurse and headache since yesterday morning that have not improved with ibuprofen. No history of similar headaches.  Was induced for preeclampsia without severe features and did not receive magnesium sulfate while in the hospital.  Location: Frontal Quality: Throbbing Severity: 6/10 on pain scale Duration: 36 hours Course: Unchanged Timing: Constant Modifying factors: No improvement with ibuprofen, hydration, resting in a dark room Associated signs and symptoms: Negative for fever, chills, vision changes, epigastric pain, nausea, vomiting, sensitivity to light or sound, difficulties with speech or gait, weakness, congestion, sinus pressure.  Past Medical History  Diagnosis Date  . Medical history non-contributory   . Anemia    OB History  Gravida Para Term Preterm AB SAB TAB Ectopic Multiple Living  0 3    # Outcome Date GA Lbr Len/2nd Weight Sex Delivery Anes PTL Lv  3 Term 10/01/15 [redacted]w[redacted]d 07:07 / 00:15 6 lb 13.5 oz (3.105 kg) M Vag-Spont Local  Y     Comments: WNL  2 Term 06/16/00    F Vag-Spont   Y  1 Term 01/04/99    F Vag-Spont   Y     Past Surgical History  Procedure Laterality Date  . No past surgeries     Social History   Social History  . Marital Status: Single    Spouse Name: N/A  . Number of Children: N/A  . Years of Education: N/A   Occupational History  . Not on file.   Social History Main Topics  . Smoking status: Never Smoker   . Smokeless tobacco: Never Used  . Alcohol Use: No  . Drug Use: No  . Sexual Activity: Not Currently   Other Topics Concern  . Not on file   Social History Narrative   No current facility-administered medications  on file prior to encounter.   Current Outpatient Prescriptions on File Prior to Encounter  Medication Sig Dispense Refill  . ibuprofen (ADVIL,MOTRIN) 600 MG tablet Take 1 tablet (600 mg total) by mouth every 6 (six) hours. (Patient not taking: Reported on 10/06/2015) 30 tablet 0   Allergies  Allergen Reactions  . Strawberry Extract Anaphylaxis    I have reviewed the past Medical Hx, Surgical Hx, Social Hx, Allergies and Medications.   Review of Systems  Constitutional: Negative for fever and chills.  HENT: Negative for sinus pressure.   Respiratory: Negative for cough.   Cardiovascular: Negative for leg swelling.  Gastrointestinal: Negative for abdominal pain.  Genitourinary: Positive for vaginal bleeding.  Musculoskeletal: Negative for gait problem.  Neurological: Positive for headaches. Negative for dizziness, facial asymmetry and speech difficulty.    OBJECTIVE Patient Vitals for the past 24 hrs:  BP Temp Temp src Pulse Resp  10/06/15 2117 159/80 mmHg - - 66 -  10/06/15 2102 164/81 mmHg - - 69 -  10/06/15 2047 177/96 mmHg - - 67 -  10/06/15 2032 177/93 mmHg - - 64 -  10/06/15 2017 164/81 mmHg - - 60 -  10/06/15 2002 169/85 mmHg - - 62 -  10/06/15 1947 165/84 mmHg - - 62 -  10/06/15 1932 160/87 mmHg - - 63 -  10/06/15 1915 173/100 mmHg - - 72 -  10/06/15 1900 160/98 mmHg - - 77 -  10/06/15 1847 162/86 mmHg - - 66 -  10/06/15 1830 170/88 mmHg - - 66 -  10/06/15 1821 176/95 mmHg - - 63 -  10/06/15 1758 154/96 mmHg - - 76 -  10/06/15 1730 162/100 mmHg - - 80 -  10/06/15 1715 149/91 mmHg - - 74 -  10/06/15 1700 151/95 mmHg - - 70 -  10/06/15 1646 159/90 mmHg - - 78 -  10/06/15 1630 (!) 166/102 mmHg - - 77 -  10/06/15 1622 149/93 mmHg - - 78 -  10/06/15 1613 142/96 mmHg 98 F (36.7 C) Oral 92 18   Constitutional: Well-developed, well-nourished female in no acute distress.  Cardiovascular: normal rate Respiratory: normal rate and effort.  GI: Abd soft, non-tender. MS:  Extremities nontender, Tr pedal edema, normal ROM Neurologic: Alert and oriented x 4. Deep tendon reflexes 2+. No clonus. GU: Deferred  LAB RESULTS Results for orders placed or performed during the hospital encounter of 10/06/15 (from the past 24 hour(s))  Protein / creatinine ratio, urine     Status: Abnormal   Collection Time: 10/06/15  4:15 PM  Result Value Ref Range   Creatinine, Urine 185.00 mg/dL   Total Protein, Urine 74 mg/dL   Protein Creatinine Ratio 0.40 (H) 0.00 - 0.15 mg/mg[Cre]  Urinalysis, Routine w reflex microscopic (not at [facility])     Status: Abnormal   Collection Time: 10/06/15  4:15 PM  Result Value Ref Range   Color, Urine YELLOW YELLOW   APPearance CLEAR CLEAR   Specific Gravity, Urine 1.010 1.005 - 1.030   pH 7.0 5.0 - 8.0   Glucose, UA NEGATIVE NEGATIVE mg/dL   Hgb urine dipstick LARGE (A) NEGATIVE   Bilirubin Urine SMALL (A) NEGATIVE   Ketones, ur 40 (A) NEGATIVE mg/dL   Protein, ur 30 (A) NEGATIVE mg/dL   Nitrite NEGATIVE NEGATIVE   Leukocytes, UA SMALL (A) NEGATIVE  Urine microscopic-add on     Status: Abnormal   Collection Time: 10/06/15  4:15 PM  Result Value Ref Range   Squamous Epithelial / LPF 0-5 (A) NONE SEEN   WBC, UA 0-5 0 - 5 WBC/hpf   RBC / HPF 0-5 0 - 5 RBC/hpf   Bacteria, UA RARE (A) NONE SEEN  CBC     Status: Abnormal   Collection Time: 10/06/15  4:52 PM  Result Value Ref Range   WBC 5.7 4.0 - 10.5 K/uL   RBC 4.21 3.87 - 5.11 MIL/uL   Hemoglobin 9.0 (L) 12.0 - 15.0 g/dL   HCT 16.1 (L) 09.6 - 04.5 %   MCV 66.3 (L) 78.0 - 100.0 fL   MCH 21.4 (L) 26.0 - 34.0 pg   MCHC 32.3 30.0 - 36.0 g/dL   RDW 40.9 (H) 81.1 - 91.4 %   Platelets 252 150 - 400 K/uL  Comprehensive metabolic panel     Status: Abnormal   Collection Time: 10/06/15  4:52 PM  Result Value Ref Range   Sodium 138 135 - 145 mmol/L   Potassium 3.5 3.5 - 5.1 mmol/L   Chloride 104 101 - 111 mmol/L   CO2 24 22 - 32 mmol/L   Glucose, Bld 89 65 - 99 mg/dL   BUN 6 6 - 20 mg/dL    Creatinine, Ser 7.82 0.44 - 1.00 mg/dL   Calcium 8.6 (L) 8.9 - 10.3 mg/dL   Total Protein 6.9 6.5 - 8.1 g/dL   Albumin 3.3 (L) 3.5 - 5.0 g/dL  AST 21 15 - 41 U/L   ALT 17 14 - 54 U/L   Alkaline Phosphatase 116 38 - 126 U/L   Total Bilirubin 2.1 (H) 0.3 - 1.2 mg/dL   GFR calc non Af Amer >60 >60 mL/min   GFR calc Af Amer >60 >60 mL/min   Anion gap 10 5 - 15    IMAGING No results found.  MAU COURSE CBC, CMP, protein creatinine ratio, cycle blood pressures.  Severe-range blood pressure occurred while RN and CNM observed patient with arm bent, laughing. Blood pressure repeated and no longer in the severe range. Offered Fioricet for headache. Patient declines. Rates her headache as mild to RN.  1800: Discussed history, exam, labs, headache, 2 severe-range blood pressures with Dr. Erin FullingHarraway-Bonny Egger. Does not recommend magnesium sulfate or admission at this time. Patient is candidate for outpatient management. Will increase HCTZ to 25 mg per day and add Norvasc 5 mg per day. Doses given in MAU. Discussed with patient who agrees with plan of care.  1856: Notified Dr. Lowry RamHaraway-Randy Whitener of severe range blood pressures at occurred since previous conversation. Per Dr. Erin FullingHarraway-Navika Hoopes the patient does not need IV BP meds at this time while awaiting onset of PO BP meds. Will continue to observe patient's blood pressure and symptoms in maternity admissions and see how she responds to meds.   2050: Notified Dr. Lowry RamHaraway Melanny Wire of all blood pressures still in the severe range. Will admit for magnesium sulfate and blood pressure management.  MDM - 35 year old female 5 days status post spontaneous vaginal delivery with postpartum preeclampsia with severe features (blood pressure, headache).   ASSESSMENT 1. Pre-eclampsia, severe, postpartum condition    PLAN Admit to women's unit per consult with Dr. Lowry RamHaraway-Uchenna Seufert. Magnesium sulfate 4 g load, 2 g/h for seizure prophylaxis Continue Norvasc and HCTZ. Will  increase Norvasc to 10 mg daily. Tylenol and oxycodone as needed for headache. Patient requests liquid.  [place] Aina [place], [place] 10/06/2015  9:45 PM

## 2015-10-06 NOTE — H&P (Signed)
Expand All Collapse All   Chief Complaint: Hypertension  First Provider Initiated Contact with Patient 10/06/15 1656    SUBJECTIVE HPI: Morgan Howell is a 35 y.o. G3P3001 at 5 days status post spontaneous vaginal delivery on 10/01/2015 who presents to Maternity Admissions reporting elevated blood pressure per home health nurse and headache since yesterday morning that have not improved with ibuprofen. No history of similar headaches.  Was induced for preeclampsia without severe features and did not receive magnesium sulfate while in the hospital.  Location: Frontal Quality: Throbbing Severity: 6/10 on pain scale Duration: 36 hours Course: Unchanged Timing: Constant Modifying factors: No improvement with ibuprofen, hydration, resting in a dark room Associated signs and symptoms: Negative for fever, chills, vision changes, epigastric pain, nausea, vomiting, sensitivity to light or sound, difficulties with speech or gait, weakness, congestion, sinus pressure.  Past Medical History  Diagnosis Date  . Medical history non-contributory   . Anemia    OB History  Gravida Para Term Preterm AB SAB TAB Ectopic Multiple Living  3 3 3       0 3    # Outcome Date GA Lbr Len/2nd Weight Sex Delivery Anes PTL Lv  3 Term 10/01/15 6925w1d 07:07 / 00:15 6 lb 13.5 oz (3.105 kg) M Vag-Spont Local  Y   Comments: WNL  2 Term 06/16/00    F Vag-Spont   Y  1 Term 01/04/99    F Vag-Spont   Y     Past Surgical History  Procedure Laterality Date  . No past surgeries     Social History   Social History  . Marital Status: Single    Spouse Name: N/A  . Number of Children: N/A  . Years of Education: N/A   Occupational History  . Not on file.   Social History Main Topics  . Smoking status: Never Smoker   . Smokeless tobacco: Never Used  . Alcohol Use: No  . Drug Use: No   . Sexual Activity: Not Currently   Other Topics Concern  . Not on file   Social History Narrative   No current facility-administered medications on file prior to encounter.   Current Outpatient Prescriptions on File Prior to Encounter  Medication Sig Dispense Refill  . ibuprofen (ADVIL,MOTRIN) 600 MG tablet Take 1 tablet (600 mg total) by mouth every 6 (six) hours. (Patient not taking: Reported on 10/06/2015) 30 tablet 0   Allergies  Allergen Reactions  . Strawberry Extract Anaphylaxis    I have reviewed the past Medical Hx, Surgical Hx, Social Hx, Allergies and Medications.   Review of Systems  Constitutional: Negative for fever and chills.  HENT: Negative for sinus pressure.  Respiratory: Negative for cough.  Cardiovascular: Negative for leg swelling.  Gastrointestinal: Negative for abdominal pain.  Genitourinary: Positive for vaginal bleeding.  Musculoskeletal: Negative for gait problem.  Neurological: Positive for headaches. Negative for dizziness, facial asymmetry and speech difficulty.    OBJECTIVE Patient Vitals for the past 24 hrs:  BP Temp Temp src Pulse Resp  10/06/15 2117 159/80 mmHg - - 66 -  10/06/15 2102 164/81 mmHg - - 69 -  10/06/15 2047 177/96 mmHg - - 67 -  10/06/15 2032 177/93 mmHg - - 64 -  10/06/15 2017 164/81 mmHg - - 60 -  10/06/15 2002 169/85 mmHg - - 62 -  10/06/15 1947 165/84 mmHg - - 62 -  10/06/15 1932 160/87 mmHg - - 63 -  10/06/15 1915 173/100 mmHg - - 72 -  10/06/15 1900 160/98 mmHg - - 77 -  10/06/15 1847 162/86 mmHg - - 66 -  10/06/15 1830 170/88 mmHg - - 66 -  10/06/15 1821 176/95 mmHg - - 63 -  10/06/15 1758 154/96 mmHg - - 76 -  10/06/15 1730 162/100 mmHg - - 80 -  10/06/15 1715 149/91 mmHg - - 74 -  10/06/15 1700 151/95 mmHg - - 70 -  10/06/15 1646 159/90 mmHg - - 78 -  10/06/15  1630 (!) 166/102 mmHg - - 77 -  10/06/15 1622 149/93 mmHg - - 78 -  10/06/15 1613 142/96 mmHg 98 F (36.7 C) Oral 92 18   Constitutional: Well-developed, well-nourished female in no acute distress.  Cardiovascular: normal rate Respiratory: normal rate and effort.  GI: Abd soft, non-tender. MS: Extremities nontender, Tr pedal edema, normal ROM Neurologic: Alert and oriented x 4. Deep tendon reflexes 2+. No clonus. GU: Deferred  LAB RESULTS  Lab Results Last 24 Hours    Results for orders placed or performed during the hospital encounter of 10/06/15 (from the past 24 hour(s))  Protein / creatinine ratio, urine Status: Abnormal   Collection Time: 10/06/15 4:15 PM  Result Value Ref Range   Creatinine, Urine 185.00 mg/dL   Total Protein, Urine 74 mg/dL   Protein Creatinine Ratio 0.40 (H) 0.00 - 0.15 mg/mg[Cre]  Urinalysis, Routine w reflex microscopic (not at [facility]) Status: Abnormal   Collection Time: 10/06/15 4:15 PM  Result Value Ref Range   Color, Urine YELLOW YELLOW   APPearance CLEAR CLEAR   Specific Gravity, Urine 1.010 1.005 - 1.030   pH 7.0 5.0 - 8.0   Glucose, UA NEGATIVE NEGATIVE mg/dL   Hgb urine dipstick LARGE (A) NEGATIVE   Bilirubin Urine SMALL (A) NEGATIVE   Ketones, ur 40 (A) NEGATIVE mg/dL   Protein, ur 30 (A) NEGATIVE mg/dL   Nitrite NEGATIVE NEGATIVE   Leukocytes, UA SMALL (A) NEGATIVE  Urine microscopic-add on Status: Abnormal   Collection Time: 10/06/15 4:15 PM  Result Value Ref Range   Squamous Epithelial / LPF 0-5 (A) NONE SEEN   WBC, UA 0-5 0 - 5 WBC/hpf   RBC / HPF 0-5 0 - 5 RBC/hpf   Bacteria, UA RARE (A) NONE SEEN  CBC Status: Abnormal   Collection Time: 10/06/15 4:52 PM  Result Value Ref Range   WBC 5.7 4.0 - 10.5 K/uL   RBC 4.21 3.87 - 5.11 MIL/uL   Hemoglobin 9.0 (L) 12.0 - 15.0 g/dL   HCT 16.1  (L) 09.6 - 46.0 %   MCV 66.3 (L) 78.0 - 100.0 fL   MCH 21.4 (L) 26.0 - 34.0 pg   MCHC 32.3 30.0 - 36.0 g/dL   RDW 04.5 (H) 40.9 - 81.1 %   Platelets 252 150 - 400 K/uL  Comprehensive metabolic panel Status: Abnormal   Collection Time: 10/06/15 4:52 PM  Result Value Ref Range   Sodium 138 135 - 145 mmol/L   Potassium 3.5 3.5 - 5.1 mmol/L   Chloride 104 101 - 111 mmol/L   CO2 24 22 - 32 mmol/L   Glucose, Bld 89 65 - 99 mg/dL   BUN 6 6 - 20 mg/dL   Creatinine, Ser 9.14 0.44 - 1.00 mg/dL   Calcium 8.6 (L) 8.9 - 10.3 mg/dL   Total Protein 6.9 6.5 - 8.1 g/dL   Albumin 3.3 (L) 3.5 - 5.0 g/dL   AST 21 15 - 41 U/L   ALT 17 14 - 54 U/L  Alkaline Phosphatase 116 38 - 126 U/L   Total Bilirubin 2.1 (H) 0.3 - 1.2 mg/dL   GFR calc non Af Amer >60 >60 mL/min   GFR calc Af Amer >60 >60 mL/min   Anion gap 10 5 - 15      IMAGING  Imaging Results    No results found.    MAU COURSE CBC, CMP, protein creatinine ratio, cycle blood pressures.  Severe-range blood pressure occurred while RN and CNM observed patient with arm bent, laughing. Blood pressure repeated and no longer in the severe range. Offered Fioricet for headache. Patient declines. Rates her headache as mild to RN.  1800: Discussed history, exam, labs, headache, 2 severe-range blood pressures with Dr. Erin Fulling. Does not recommend magnesium sulfate or admission at this time. Patient is candidate for outpatient management. Will increase HCTZ to 25 mg per day and add Norvasc 5 mg per day. Doses given in MAU. Discussed with patient who agrees with plan of care.  1856: Notified Dr. Lowry Ram of severe range blood pressures at occurred since previous conversation. Per Dr. Erin Fulling the patient does not need IV BP meds at this time while awaiting onset of PO BP meds. Will continue to observe patient's blood pressure and symptoms  in maternity admissions and see how she responds to meds.   2050: Notified Dr. Lowry Ram of all blood pressures still in the severe range. Will admit for magnesium sulfate and blood pressure management.  MDM - 35 year old female 5 days status post spontaneous vaginal delivery with postpartum preeclampsia with severe features (blood pressure, headache).   ASSESSMENT 1. Pre-eclampsia, severe, postpartum condition    PLAN Admit to women's unit per consult with Dr. Lowry Ram. Magnesium sulfate 4 g load, 2 g/h for seizure prophylaxis Continue Norvasc and HCTZ. Will increase Norvasc to 10 mg daily. Tylenol and oxycodone as needed for headache. Patient requests liquid.  [place], [place] 10/06/2015  9:45 PM

## 2015-10-06 NOTE — MAU Note (Signed)
Hold labetalol protocol per Dr Erin FullingHarraway-Smith

## 2015-10-06 NOTE — MAU Note (Signed)
Pt sent to MAU by home nurse, BP is elevated, pt has had HA since yesterday, not improved with motrin.  Vaginal delivery on 7/13.  Had preeclampsia during labor.  Denies visual changes or epigastric pain.    Is spotting, no excessive bleeding.

## 2015-10-07 NOTE — Progress Notes (Signed)
Post Partum Day 6 Subjective: up ad lib, voiding and tolerating PO. Pt admitted for elevated BP's with HA 6 days PP.  Pt has note taken meds for her HA.  Objective: Blood pressure 140/85, pulse 91, temperature 97.6 F (36.4 C), temperature source Oral, resp. rate 14, height 5' (1.524 m), weight 169 lb (76.658 kg), unknown if currently breastfeeding.  Physical Exam:  Lungs: clear CV:RRR General: alert and no distress Lochia: appropriate Uterine Fundus: firm DVT Evaluation: No evidence of DVT seen on physical exam.   Recent Labs  10/06/15 1652  HGB 9.0*  HCT 27.9*    Assessment/Plan: Continue on PP magnesium sulfate for 24 hours NOrvasc 10mg  daily HCTZ 25 mg daily Suspect d/c in am.     LOS: 1 day   HARRAWAY-SMITH, Tiye Huwe 10/07/2015, 6:44 AM

## 2015-10-08 DIAGNOSIS — O1415 Severe pre-eclampsia, complicating the puerperium: Principal | ICD-10-CM

## 2015-10-08 MED ORDER — HYDROCHLOROTHIAZIDE 25 MG PO TABS: 25.0000 mg | ORAL_TABLET | Freq: Every day | ORAL | Status: DC

## 2015-10-08 MED ORDER — AMLODIPINE BESYLATE 10 MG PO TABS: 10.0000 mg | ORAL_TABLET | Freq: Every day | ORAL | Status: DC

## 2015-10-08 NOTE — Discharge Instructions (Signed)

## 2015-10-08 NOTE — Discharge Summary (Signed)
    OB Discharge Summary     Patient Name: Morgan Howell DOB: [birth year] MRN: [id number]  Date of admission: 10/06/2015 Delivering MD: This patient has no babies on file.  Date of discharge: 10/08/2015  Admitting diagnosis: delivered, blood pressure Intrauterine pregnancy: Unknown     Secondary diagnosis:  Active Problems:   Pre-eclampsia, severe, postpartum condition  Additional problems: none     Discharge diagnosis: postpartum preeclampsia                                                                                                Hospital course:  Patient admitted on 7/18 for postpartum preeclampsia and was given magnesium sulfate for prophylaxis.  She was also started on amlodipine and HCTZ  Physical exam  Filed Vitals:   10/07/15 1709 10/07/15 2109 10/08/15 0816 10/08/15 1208  BP: 124/89 133/83 120/91 126/83  Pulse: 104 102 99 98  Temp:  98.3 F (36.8 C) 98.5 F (36.9 C) 98.9 F (37.2 C)  TempSrc:  Oral Oral Oral  Resp:  16 20 20   Height:      Weight:       General: alert, cooperative and no distress Lochia: appropriate Uterine Fundus: firm DVT Evaluation: No evidence of DVT seen on physical exam. Negative Homan's sign. Labs: Lab Results  Component Value Date   WBC 5.7 10/06/2015   HGB 9.0* 10/06/2015   HCT 27.9* 10/06/2015   MCV 66.3* 10/06/2015   PLT 252 10/06/2015   CMP Latest Ref Rng 10/06/2015  Glucose 65 - 99 mg/dL 89  BUN 6 - 20 mg/dL 6  Creatinine 4.090.44 - 8.111.00 mg/dL 9.140.64  Sodium 782135 - 956145 mmol/L 138  Potassium 3.5 - 5.1 mmol/L 3.5  Chloride 101 - 111 mmol/L 104  CO2 22 - 32 mmol/L 24  Calcium 8.9 - 10.3 mg/dL 2.1(H8.6(L)  Total Protein 6.5 - 8.1 g/dL 6.9  Total Bilirubin 0.3 - 1.2 mg/dL 2.1(H)  Alkaline Phos 38 - 126 U/L 116  AST 15 - 41 U/L 21  ALT 14 - 54 U/L 17    Discharge instruction: per After Visit Summary and "Baby and Me Booklet".  After visit meds:    Medication List    TAKE these medications        amLODipine 10 MG tablet   Commonly known as:  NORVASC  Take 1 tablet (10 mg total) by mouth daily.     hydrochlorothiazide 25 MG tablet  Commonly known as:  HYDRODIURIL  Take 1 tablet (25 mg total) by mouth daily.     ibuprofen 600 MG tablet  Commonly known as:  ADVIL,MOTRIN  Take 1 tablet (600 mg total) by mouth every 6 (six) hours.        Diet: routine diet  Activity: Advance as tolerated. Pelvic rest for 6 weeks.   Outpatient follow up:2 weeks Follow up Appt:No future appointments. Follow up Visit:No Follow-up on file.  < 30minutes spent on discharge.  10/08/2015 Minetta Krisher JEHIEL, DO

## 2016-03-24 ENCOUNTER — Other Ambulatory Visit (HOSPITAL_COMMUNITY)
Admission: RE | Admit: 2016-03-24 | Discharge: 2016-03-24 | Disposition: A | Discharge status: Home / Self Care | Payer: BLUE CROSS/BLUE SHIELD | Source: Ambulatory Visit | Attending: Nurse Practitioner | Admitting: Nurse Practitioner

## 2016-03-24 ENCOUNTER — Other Ambulatory Visit: Payer: Self-pay | Admitting: Nurse Practitioner

## 2016-03-24 DIAGNOSIS — Z1151 Encounter for screening for human papillomavirus (HPV): Secondary | ICD-10-CM | POA: Insufficient documentation

## 2016-03-24 DIAGNOSIS — Z01419 Encounter for gynecological examination (general) (routine) without abnormal findings: Secondary | ICD-10-CM | POA: Diagnosis not present

## 2016-03-24 DIAGNOSIS — Z309 Encounter for contraceptive management, unspecified: Secondary | ICD-10-CM | POA: Diagnosis not present

## 2016-03-24 DIAGNOSIS — N760 Acute vaginitis: Secondary | ICD-10-CM | POA: Diagnosis not present

## 2016-03-28 LAB — CYTOLOGY - PAP
DIAGNOSIS: NEGATIVE
HPV (WINDOPATH): NOT DETECTED

## 2016-04-19 DIAGNOSIS — Z308 Encounter for other contraceptive management: Secondary | ICD-10-CM | POA: Diagnosis not present

## 2016-07-19 DIAGNOSIS — Z309 Encounter for contraceptive management, unspecified: Secondary | ICD-10-CM | POA: Diagnosis not present

## 2016-07-19 DIAGNOSIS — E559 Vitamin D deficiency, unspecified: Secondary | ICD-10-CM | POA: Diagnosis not present

## 2016-07-19 DIAGNOSIS — N760 Acute vaginitis: Secondary | ICD-10-CM | POA: Diagnosis not present

## 2017-09-30 IMAGING — US US MFM FETAL BPP W/O NON-STRESS
1 series · 11 of 11 positions shown · non-contrast
Comparison: none

[Series 1: us mfm fetal bpp w/o non-stress · 11 acquisitions, 11 frames shown]
[im 1/11]
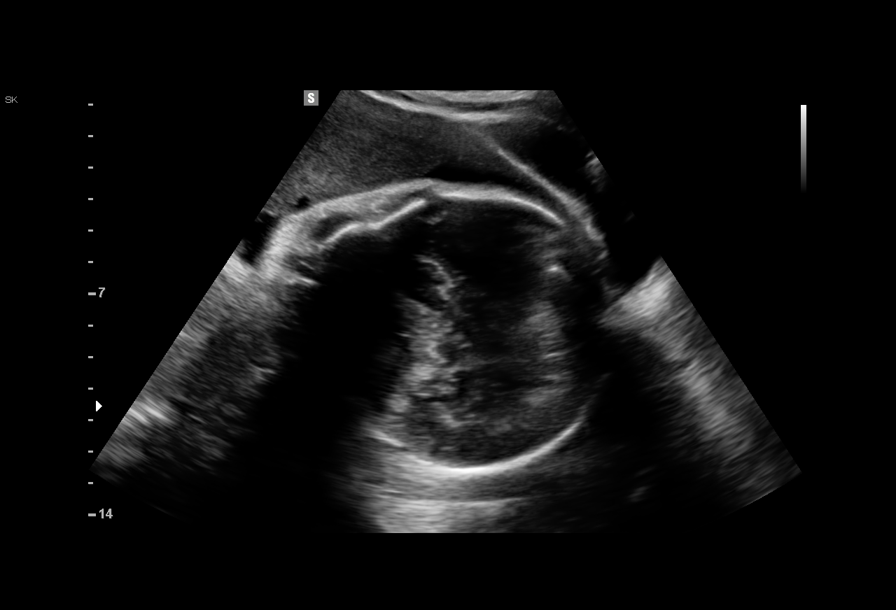
[im 2/11]
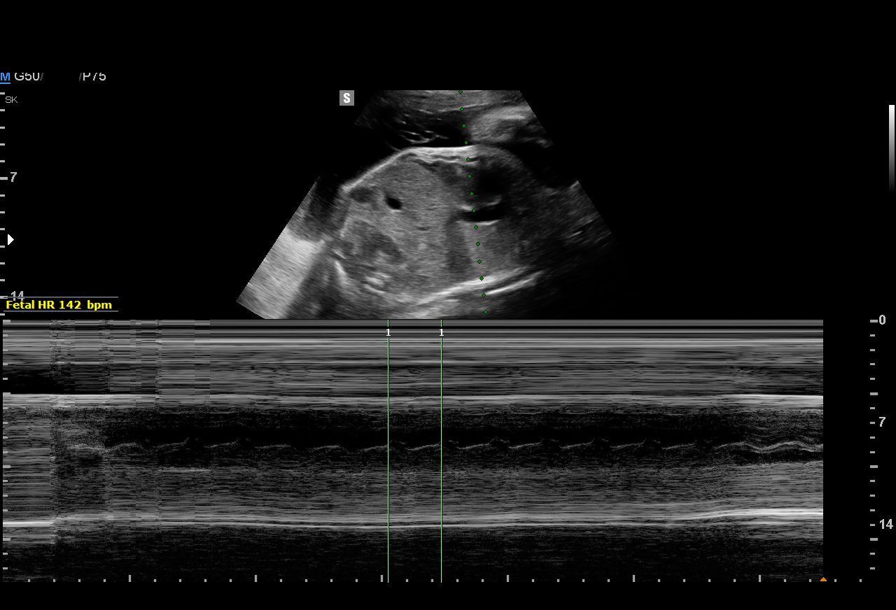
[im 3/11]
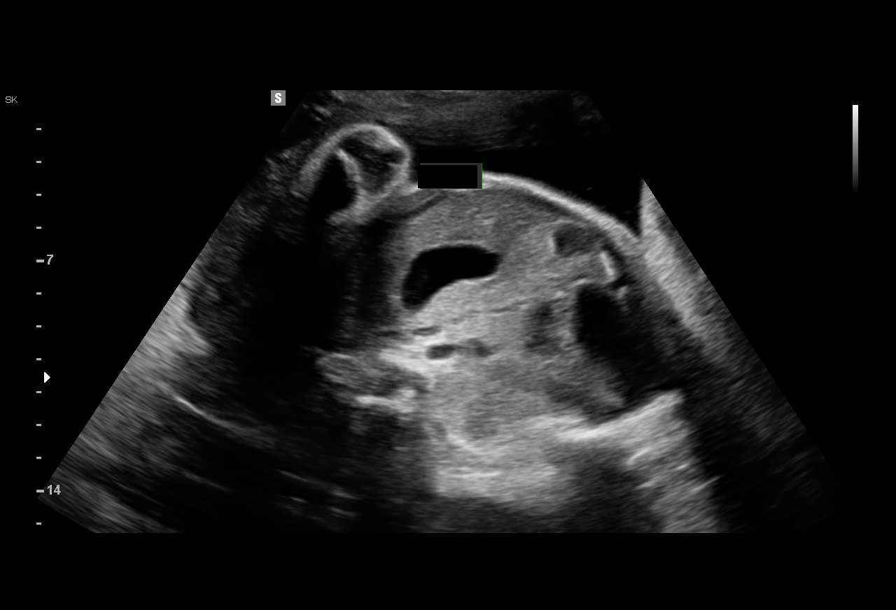
[im 4/11]
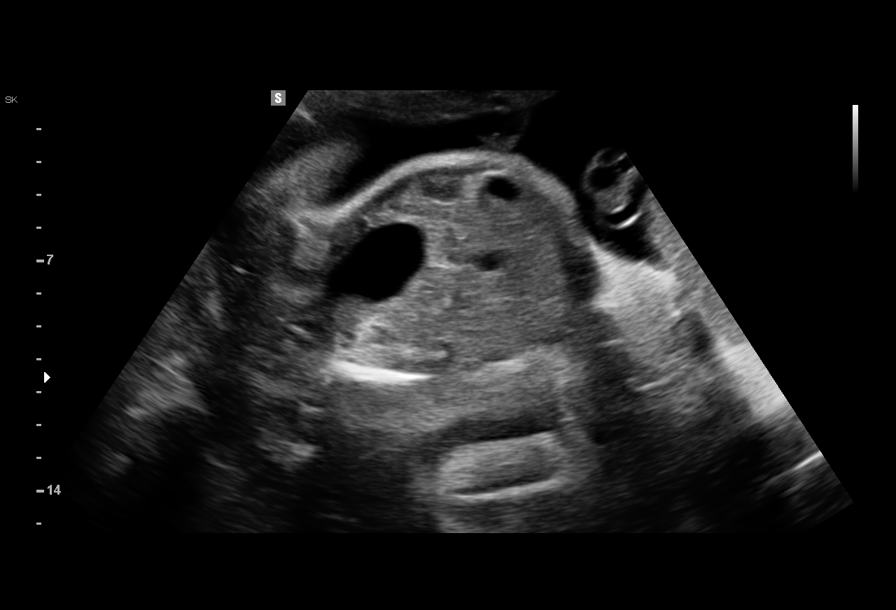
[im 5/11]
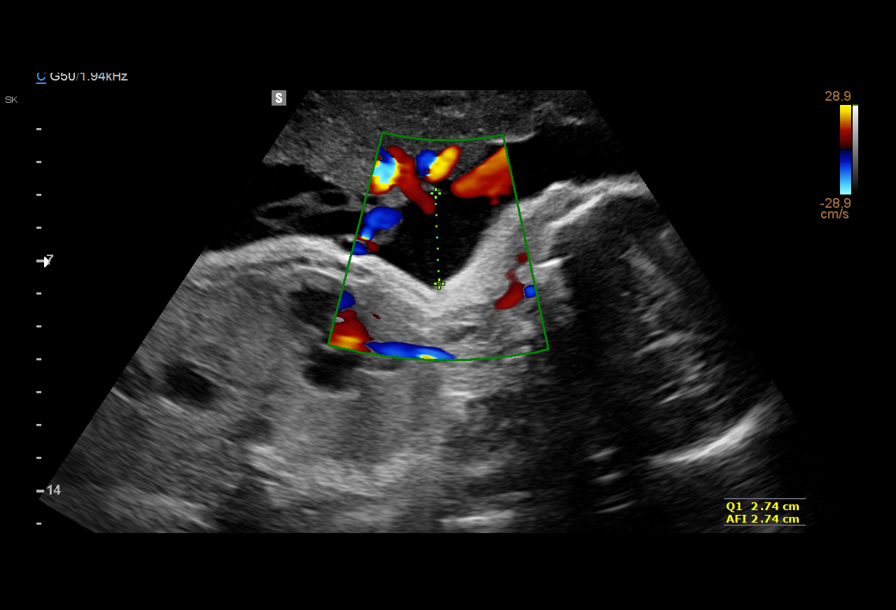
[im 6/11]
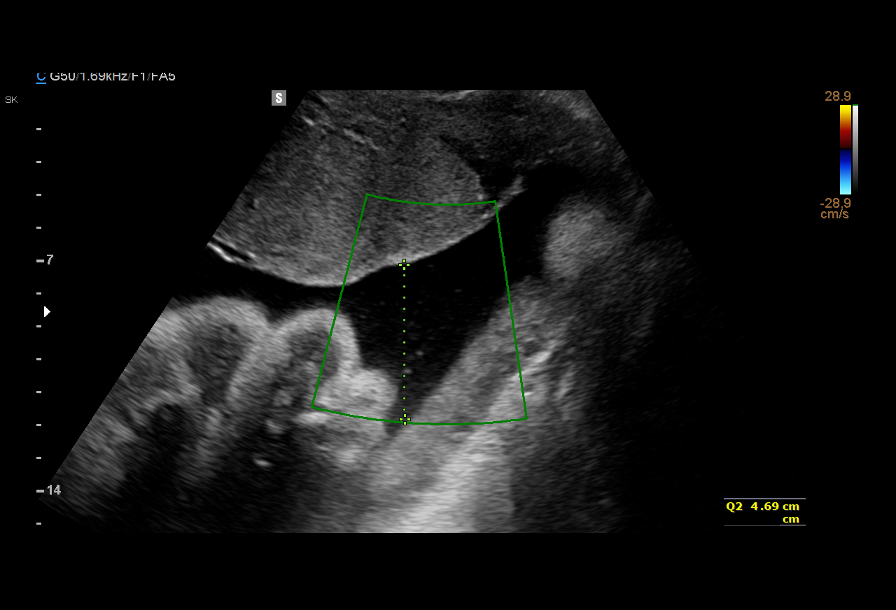
[im 7/11]
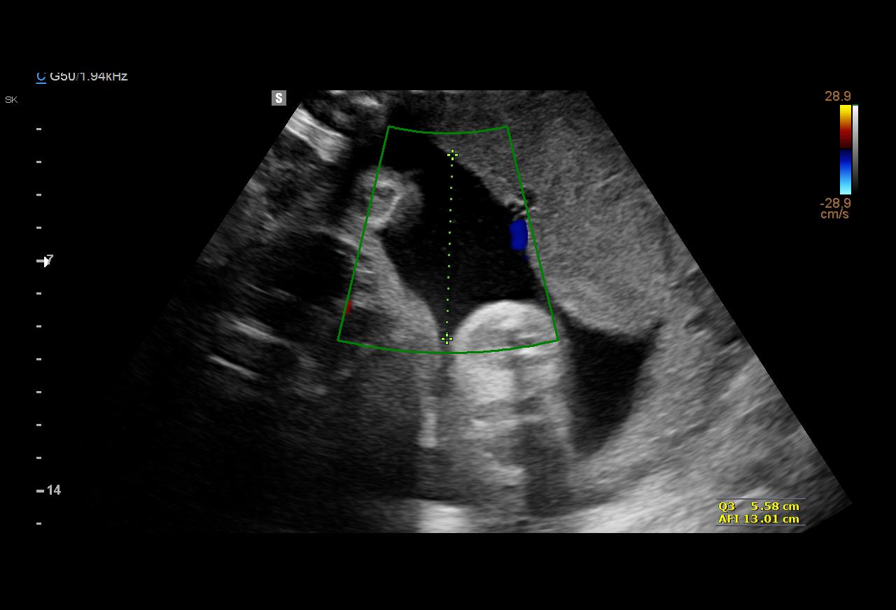
[im 8/11]
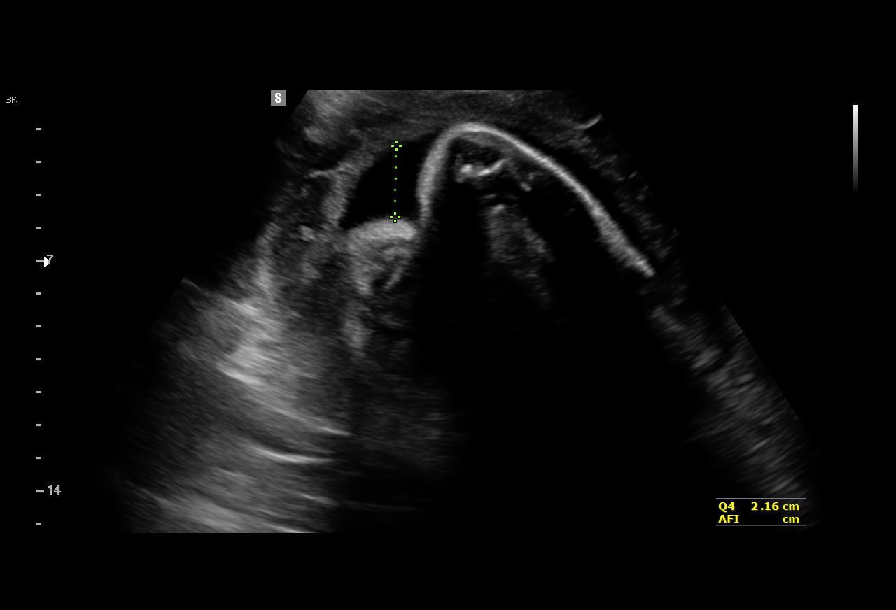
[im 9/11]
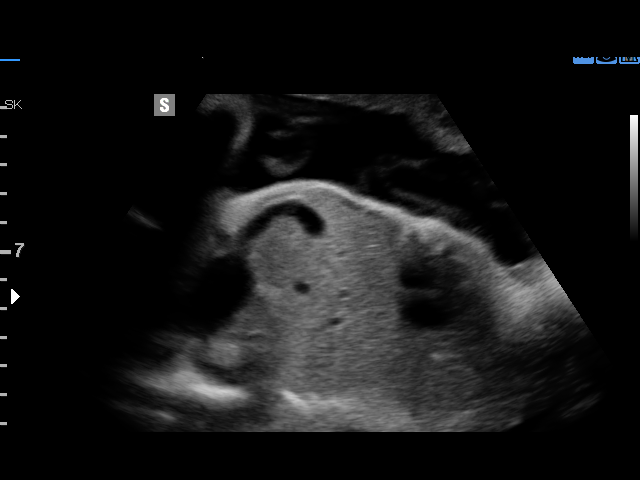
[im 10/11]
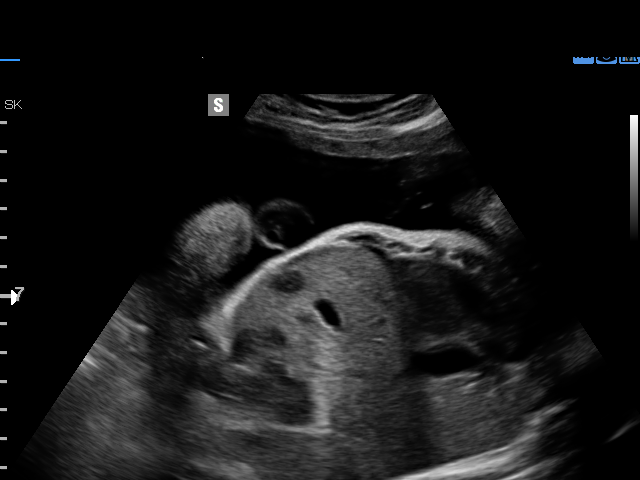
[im 11/11]
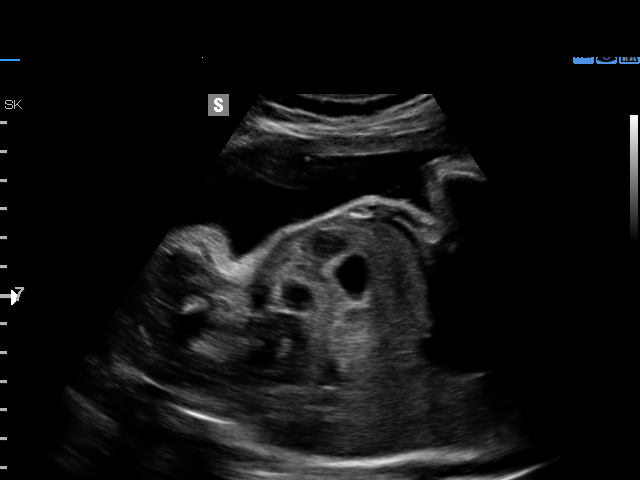

[11 of 11 positions shown; findings below may reference images not displayed]

MAU/Triage

1  DALIBOR KENI BRATULJEVIC            [id number]      [id number]     [id number]
Indications

34 weeks gestation of pregnancy
Traumatic injury during pregnancy - fall
Fetal heart rate decelerations affecting       O76
management of mother
OB History

Gravidity:    3         Term:   2
Living:       2
Fetal Evaluation

Num Of Fetuses:     1
Fetal Heart         142
Rate(bpm):
Cardiac Activity:   Observed
Presentation:       Cephalic

Amniotic Fluid
AFI FV:      Subjectively within normal limits

AFI Sum(cm)     %Tile       Largest Pocket(cm)
15.17           54

RUQ(cm)       RLQ(cm)       LUQ(cm)        LLQ(cm)
2.74
Biophysical Evaluation

Amniotic F.V:   Within normal limits       F. Tone:        Observed
F. Movement:    Observed                   Score:          [DATE]
F. Breathing:   Observed
Gestational Age

Clinical EDD:  34w 0d                                        EDD:   10/07/15
Best:          34w 0d    Det. By:   Clinical EDD             EDD:   10/07/15
Anatomy

Stomach:               Appears normal, left   Bladder:                Appears normal
sided
Cervix Uterus Adnexa

Cervix
Not visualized (advanced GA >22wks)
Impression

SIUP at 34+0 weeks
Cephalic presentation
Normal amniotic fluid volume
BPP [DATE]
Placenta appeared to be located anteriorly - limited views but
no SCH identified
Recommendations

Follow-up ultrasounds as clinically indicated.

## 2018-07-12 ENCOUNTER — Encounter: Payer: Self-pay | Admitting: Family Medicine

## 2019-01-28 DIAGNOSIS — M79671 Pain in right foot: Secondary | ICD-10-CM | POA: Diagnosis not present

## 2019-01-28 DIAGNOSIS — S92531A Displaced fracture of distal phalanx of right lesser toe(s), initial encounter for closed fracture: Secondary | ICD-10-CM | POA: Diagnosis not present

## 2019-02-23 DIAGNOSIS — M5441 Lumbago with sciatica, right side: Secondary | ICD-10-CM | POA: Diagnosis not present

## 2019-07-11 ENCOUNTER — Ambulatory Visit: Payer: BC Managed Care – PPO | Attending: Internal Medicine

## 2019-07-11 DIAGNOSIS — Z23 Encounter for immunization: Secondary | ICD-10-CM

## 2019-07-11 NOTE — Progress Notes (Signed)
   Covid-19 Vaccination Clinic  Name:  Morgan Howell    MRN: [id number] DOB: 02-09-1981  07/11/2019  [name] was observed post Covid-19 immunization for 15 minutes without incident. She was provided with Vaccine Information Sheet and instruction to access the V-Safe system.   [name] was instructed to call 911 with any severe reactions post vaccine: Marland Kitchen Difficulty breathing  . Swelling of face and throat  . A fast heartbeat  . A bad rash all over body  . Dizziness and weakness   Immunizations Administered    Name Date Dose VIS Date Route   Pfizer COVID-19 Vaccine 07/11/2019 10:08 AM 0.3 mL 05/15/2018 Intramuscular   Manufacturer: [employer], [employer]   Lot: [id number]   NDC: [address]-6

## 2019-08-05 ENCOUNTER — Ambulatory Visit: Payer: BC Managed Care – PPO | Attending: Internal Medicine

## 2019-08-05 DIAGNOSIS — Z23 Encounter for immunization: Secondary | ICD-10-CM

## 2019-08-05 NOTE — Progress Notes (Signed)
   Covid-19 Vaccination [facility]  Name:  Morgan Howell    MRN: [id number] DOB: 09-Apr-1980  08/05/2019  [name] was observed post Covid-19 immunization for 15 minutes without incident. She was provided with Vaccine Information Sheet and instruction to access the V-Safe system.   [name] was instructed to call 911 with any severe reactions post vaccine: Marland Kitchen Difficulty breathing  . Swelling of face and throat  . A fast heartbeat  . A bad rash all over body  . Dizziness and weakness   Immunizations Administered    Name Date Dose VIS Date Route   Pfizer COVID-19 Vaccine 08/05/2019  4:06 PM 0.3 mL 05/15/2018 Intramuscular   Manufacturer: [employer], [employer]   Lot: [id number]   NDC: [address]-2

## 2020-04-07 ENCOUNTER — Other Ambulatory Visit (HOSPITAL_COMMUNITY): Payer: Self-pay | Admitting: Obstetrics and Gynecology

## 2020-06-22 ENCOUNTER — Other Ambulatory Visit (HOSPITAL_COMMUNITY): Payer: Self-pay

## 2020-06-23 ENCOUNTER — Other Ambulatory Visit (HOSPITAL_COMMUNITY): Payer: Self-pay

## 2020-06-23 MED ORDER — MEDROXYPROGESTERONE ACETATE 150 MG/ML IM SUSP
INTRAMUSCULAR | 0 refills | Status: DC
  Filled 2020-06-23: qty 1, 90d supply, fill #0

## 2020-09-21 ENCOUNTER — Encounter (HOSPITAL_COMMUNITY): Payer: Self-pay

## 2020-09-21 ENCOUNTER — Ambulatory Visit (HOSPITAL_COMMUNITY)
Admission: EM | Admit: 2020-09-21 | Discharge: 2020-09-21 | Disposition: A | Discharge status: Home / Self Care | Payer: BC Managed Care – PPO | Attending: Family Medicine | Admitting: Family Medicine

## 2020-09-21 DIAGNOSIS — H209 Unspecified iridocyclitis: Secondary | ICD-10-CM

## 2020-09-21 MED ORDER — FLUORESCEIN SODIUM 1 MG OP STRP
ORAL_STRIP | OPHTHALMIC | Status: AC
  Filled 2020-09-21: qty 1

## 2020-09-21 MED ORDER — HOMATROPINE HBR 5 % OP SOLN
1.0000 [drp] | Freq: Every day | OPHTHALMIC | 0 refills | Status: AC
Start: 2020-09-21 — End: ?

## 2020-09-21 MED ORDER — PREDNISOLONE ACETATE 1 % OP SUSP: 1.0000 [drp] | Freq: Four times a day (QID) | OPHTHALMIC | 0 refills | Status: AC

## 2020-09-21 MED ORDER — TOBRAMYCIN 0.3 % OP SOLN
1.0000 [drp] | Freq: Four times a day (QID) | OPHTHALMIC | 0 refills | Status: AC
Start: 2020-09-21 — End: ?

## 2020-09-21 NOTE — Discharge Instructions (Addendum)
Call your eye doctor first thing in the morning to see if they can work you in. Do not wear your contacts until your eye doctor says you can.

## 2020-09-21 NOTE — ED Provider Notes (Signed)
[facility]   [id number] 09/21/20 Arrival Time: 0906  ASSESSMENT & PLAN:  1. Iritis of right eye    No sign of keratitis or corneal abrasion on fluorescein exam.   Discharge Instructions      Call your eye doctor first thing in the morning to see if they can work you in. Do not wear your contacts until your eye doctor says you can.    Begin: Meds ordered this encounter  Medications   homatropine 5 % ophthalmic solution    Sig: Place 1 drop into the left eye daily. Do not use for more than two days.    Dispense:  5 mL    Refill:  0   tobramycin (TOBREX) 0.3 % ophthalmic solution    Sig: Place 1 drop into the right eye every 6 (six) hours.    Dispense:  5 mL    Refill:  0   prednisoLONE acetate (PRED FORTE) 1 % ophthalmic suspension    Sig: Place 1 drop into the right eye 4 (four) times daily.    Dispense:  5 mL    Refill:  0    Warm compress to eye(s). Local eye care discussed. OTC analgesics if needed.  Reviewed expectations re: course of current medical issues. Questions answered. Outlined signs and symptoms indicating need for more acute intervention. Patient verbalized understanding. After Visit Summary given.   SUBJECTIVE:  Morgan Howell is a 40 y.o. female who presents with complaint of persistent R eye pain; gradual onset over past week. Does wear contacts; has not since pain started. Injury: no. Visual changes: none. "A little blurry because of watering". Recent illness: no. Self treatment: none PTA. No h/o similar.  OBJECTIVE:  Vitals:   09/21/20 0928  BP: 128/88  Pulse: 78  Resp: 18  Temp: 98.3 F (36.8 C)  TempSrc: Oral  SpO2: 100%    General appearance: alert; no distress HEENT: [place]; AT; PERRLA; no restriction of the extraocular movements OD: with reported pain; with conjunctival injection; with watery drainage; without corneal opacities; with limbal flush; without periorbital swelling or erythema Neck: supple without  LAD Lungs: unlabored respirations Skin: warm and dry Psychological: alert and cooperative; normal mood and affect    Allergies  Allergen Reactions   Strawberry Extract Anaphylaxis    Past Medical History:  Diagnosis Date   Anemia    Medical history non-contributory    Social History   Socioeconomic History   Marital status: Single    Spouse name: Not on file   Number of children: Not on file   Years of education: Not on file   Highest education level: Not on file  Occupational History   Not on file  Tobacco Use   Smoking status: Never   Smokeless tobacco: Never  Substance and Sexual Activity   Alcohol use: No   Drug use: No   Sexual activity: Not Currently  Other Topics Concern   Not on file  Social History Narrative   Not on file   Social Determinants of Health   Financial Resource Strain: Not on file  Food Insecurity: Not on file  Transportation Needs: Not on file  Physical Activity: Not on file  Stress: Not on file  Social Connections: Not on file  Intimate Partner Violence: Not on file   History reviewed. No pertinent family history. Past Surgical History:  Procedure Laterality Date   NO PAST SURGERIES        Mardella Layman, MD 09/21/20 1015

## 2020-09-21 NOTE — ED Triage Notes (Signed)
Pt presents with right eye pain and sensitivity to light X 1 week.

## 2020-12-01 ENCOUNTER — Other Ambulatory Visit: Payer: Self-pay

## 2020-12-01 ENCOUNTER — Other Ambulatory Visit: Payer: Self-pay | Admitting: Obstetrics and Gynecology

## 2020-12-01 ENCOUNTER — Encounter (HOSPITAL_BASED_OUTPATIENT_CLINIC_OR_DEPARTMENT_OTHER): Payer: Self-pay | Admitting: Obstetrics and Gynecology

## 2020-12-01 NOTE — H&P (Signed)
Subjective: Chief Complaint(s):   Preop Visit/ desire for permanent sterilization   HPI:  Isolation Precautions Yes- Moderna" label="Has patient received COVID-19 vaccination?" propId="[id number]" catId="[id number]" encId="[id number]"Has patient received COVID-19 vaccination? Yes- Moderna" itemId="25066" categoryId="477813"Yes- Moderna. Does patient report new onset of COVID symptoms? No. Has patient or close contact tested positive for COVID-19? No , not in the past 2 weeks.  General 40 yo presents for preop visit. Pt will be having laparoscopic bilateral tubal ligation. Pt is currently using Depo for contraceptive management and interested in BTL for permanent sterilization. Pt advised to avoid NSAIDs (Aspirin, Aleve, Advil, Ibuprofen, Motrin) from now until surgery given risk of bleeding during surgery. She may take Tylenol for pain management. She is advised to avoid eating or drinking starting midnight prior to surgery. Discussed risks of BTL including but not limited to 3% infection, bleeding, damage to her bowel and surrounding organs with the need for further surgery. Current Medication: Taking  Depo-Provera(Medroxyprogesterone Acetate) 150 MG/ML Suspension 1 ml Intramuscular Every 3 months.     Medication List reviewed and reconciled with the patient.  Medical History:  Medical History Verified.      Allergies/Intolerance: N.K.D.A. Gyn History:  Sexual activity currently sexually active. Periods : none with depo. Denies LMP Long time ago, unsure. Birth control Depo. Last pap smear date 05/21/19-neg/HPV. Denies Last mammogram date N/A. Abnormal pap smear yes, treated with LEEP. STD Chlamydia, TRICH.   OB History:  Number of pregnancies 3. Pregnancy # 1 live birth, vaginal delivery 39 weeks, girl. Pregnancy # 2 live birth, vaginal delivery 38 weeks, girl. Pregnancy # 3 live birth, vaginal delivery, preeclampsia 39 weeks , boy.   Surgical History:  LEEP 2006   Hospitalization:  Denies Past  Hospitalization   Family History:  Father: alive    Mother: alive    Paternal Grand Father: deceased    Paternal Grand Mother: deceased    Maternal Grand Father: deceased, cancer    Maternal Grand Mother: alive, dementia    1 son(s) , 2 daughter(s) .    denies any GYN family cancer hx.  Social History: General Tobacco use cigarettes: Never smoked, Tobacco history last updated 11/25/2020, Vaping No.  no Alcohol.  no Caffeine.  no Recreational drug use.  Marital Status: single.  Children: 1, Boys, 2, girls.  OCCUPATION: employed, Airline pilot).  ROS: CONSTITUTIONAL No" label="Chills" value="" options="no,yes" propid="91" itemid="193425" categoryid="10464" encounterid="[id number]"Chills No. No" label="Fatigue" value="" options="no,yes" propid="91" itemid="172899" categoryid="10464" encounterid="[id number]"Fatigue No. No" label="Fever" value="" options="no,yes" propid="91" itemid="10467" categoryid="10464" encounterid="[id number]"Fever No. No" label="Night sweats" value="" options="no,yes" propid="91" itemid="193426" categoryid="10464" encounterid="[id number]"Night sweats No. No" label="Recent travel outside [place]" value="" options="no,yes" propid="91" itemid="444261" categoryid="10464" encounterid="[id number]"Recent travel outside [place] No. No" label="Sweats" value="" options="no,yes" propid="91" itemid="193427" categoryid="10464" encounterid="[id number]"Sweats No. No" label="Weight change" value="" options="no,yes" propid="91" itemid="194825" categoryid="10464" encounterid="[id number]"Weight change No.  OPHTHALMOLOGY no" label="Blurring of vision" value="" options="no,yes" propid="91" itemid="12520" categoryid="12516" encounterid="[id number]"Blurring of vision no. no" label="Change in vision" value="" options="no,yes" propid="91" itemid="193469" categoryid="12516" encounterid="[id number]"Change in vision no. no" label="Double vision" value="" options="no,yes" propid="91" itemid="194379" categoryid="12516"  encounterid="[id number]"Double vision no.  ENT no" label="Dizziness" value="" options="no,yes" propid="91" itemid="193612" categoryid="10481" encounterid="[id number]"Dizziness no. Nose bleeds no. Sore throat no. Teeth pain no.  ALLERGY no" label="Hives" value="" options="no,yes" propid="91" itemid="202589" categoryid="138152" encounterid="[id number]"Hives no.  CARDIOLOGY no" label="Chest pain" value="" options="no,yes" propid="91" itemid="193603" categoryid="10488" encounterid="[id number]"Chest pain no. no" label="High blood pressure" value="" options="no,yes" propid="91" itemid="199089" categoryid="10488" encounterid="[id number]"High blood pressure no. no" label="Irregular heart beat" value="" options="no,yes" propid="91" itemid="202598" categoryid="10488" encounterid="[id number]"Irregular heart beat no. no" label="Leg edema" value="" options="no,yes" propid="91" itemid="10491" categoryid="10488" encounterid="[id number]"Leg edema no. no" label="Palpitations" value=""  options="no,yes" propid="91" itemid="10490" categoryid="10488" encounterid="[id number]"Palpitations no.  RESPIRATORY no" label="Shortness of breath" value="" options="no" propid="91" itemid="270013" categoryid="138132" encounterid="[id number]"Shortness of breath no. no" label="Cough" value="" options="no,yes" propid="91" itemid="172745" categoryid="138132" encounterid="[id number]"Cough no. no" label="Wheezing" value="" options="no,yes" propid="91" itemid="193621" categoryid="138132" encounterid="[id number]"Wheezing no.  UROLOGY no" label="Pain with urination" value="" options="no,yes" propid="91" itemid="194377" categoryid="138166" encounterid="[id number]"Pain with urination no. no" label="Urinary urgency" value="" options="no,yes" propid="91" itemid="193493" categoryid="138166" encounterid="[id number]"Urinary urgency no. no" label="Urinary frequency" value="" options="no,yes" propid="91" itemid="193492" categoryid="138166" encounterid="[id number]"Urinary frequency no.  no" label="Urinary incontinence" value="" options="no,yes" propid="91" itemid="138171" categoryid="138166" encounterid="[id number]"Urinary incontinence no. No" label="Difficulty urinating" value="" options="no,yes" propid="91" itemid="138167" categoryid="138166" encounterid="[id number]"Difficulty urinating No. No" label="Blood in urine" value="" options="no,yes" propid="91" itemid="138168" categoryid="138166" encounterid="[id number]"Blood in urine No.  GASTROENTEROLOGY no" label="Abdominal pain" value="" options="no,yes" propid="91" itemid="10496" categoryid="10494" encounterid="[id number]"Abdominal pain no. no" label="Appetite change" value="" options="no,yes" propid="91" itemid="193447" categoryid="10494" encounterid="[id number]"Appetite change no. no" label="Bloating/belching" value="" options="no,yes" propid="91" itemid="193448" categoryid="10494" encounterid="[id number]"Bloating/belching no. no" label="Blood in stool or on toilet paper" value="" options="no,yes" propid="91" itemid="10503" categoryid="10494" encounterid="[id number]"Blood in stool or on toilet paper no. no" label="Change in bowel movements" value="" options="no,yes" propid="91" itemid="199106" categoryid="10494" encounterid="[id number]"Change in bowel movements no. no" label="Constipation" value="" options="no,yes" propid="91" itemid="10501" categoryid="10494" encounterid="[id number]"Constipation no. no" label="Diarrhea" value="" options="no,yes" propid="91" itemid="10502" categoryid="10494" encounterid="[id number]"Diarrhea no. no" label="Difficulty swallowing" value="" options="no,yes" propid="91" itemid="199104" categoryid="10494" encounterid="[id number]"Difficulty swallowing no. no" label="Nausea" value="" options="no,yes" propid="91" itemid="10499" categoryid="10494" encounterid="[id number]"Nausea no.  FEMALE REPRODUCTIVE no" label="Vulvar pain" value="" options="no,yes" propid="91" itemid="453725" categoryid="10525" encounterid="[id number]"Vulvar pain no. no"  label="Vulvar rash" value="" options="no,yes" propid="91" itemid="453726" categoryid="10525" encounterid="[id number]"Vulvar rash no. no" label="Abnormal vaginal bleeding" value="" options="no, yes" propid="91" itemid="444315" categoryid="10525" encounterid="[id number]"Abnormal vaginal bleeding no. no" label="Breast pain" value="" options="no,yes" propid="91" itemid="186083" categoryid="10525" encounterid="[id number]"Breast pain no. no" label="Nipple discharge" value="" options="no,yes" propid="91" itemid="186084" categoryid="10525" encounterid="[id number]"Nipple discharge no. no" label="Pain with intercourse" value="" options="no,yes" propid="91" itemid="275823" categoryid="10525" encounterid="[id number]"Pain with intercourse no. no" label="Pelvic pain" value="" options="no,yes" propid="91" itemid="186082" categoryid="10525" encounterid="[id number]"Pelvic pain no. no" label="Unusual vaginal discharge" value="" options="no,yes" propid="91" itemid="278230" categoryid="10525" encounterid="[id number]"Unusual vaginal discharge no. no" label="Vaginal itching" value="" options="no,yes" propid="91" itemid="278942" categoryid="10525" encounterid="[id number]"Vaginal itching no.  MUSCULOSKELETAL no" label="Muscle aches" value="" options="no,yes" propid="91" itemid="193461" categoryid="10514" encounterid="[id number]"Muscle aches no.  NEUROLOGY no" label="Headache" value="" options="no,yes" propid="91" itemid="12513" categoryid="12512" encounterid="[id number]"Headache no. no" label="Tingling/numbness" value="" options="no,yes" propid="91" itemid="12514" categoryid="12512" encounterid="[id number]"Tingling/numbness no. no" label="Weakness" value="" options="no,yes" propid="91" itemid="193468" categoryid="12512" encounterid="[id number]"Weakness no.  PSYCHOLOGY no" label="Depression" value="" options="" propid="91" itemid="275919" categoryid="10520" encounterid="[id number]"Depression no. no" label="Anxiety" value="" options="no,yes" propid="91"  itemid="172748" categoryid="10520" encounterid="14036619"Anxiety no. no" label="Nervousness" value="" options="no,yes" propid="91" itemid="199158" categoryid="10520" encounterid="14036619"Nervousness no. no" label="Sleep disturbances" value="" options="no,yes" propid="91" itemid="12502" categoryid="10520" encounterid="14036619"Sleep disturbances no. no " label="Suicidal ideation" value="" options="no,yes" propid="91" itemid="72718" categoryid="10520" encounterid="14036619"Suicidal ideation no .  ENDOCRINOLOGY no" label="Excessive thirst" value="" options="no,yes" propid="91" itemid="194628" categoryid="12508" encounterid="14036619"Excessive thirst no. no" label="Excessive urination" value="" options="no,yes" propid="91" itemid="196285" categoryid="12508" encounterid="14036619"Excessive urination no. no" label="Hair loss" value="" options="no, yes" propid="91" itemid="444314" categoryid="12508" encounterid="14036619"Hair loss no. no" label="Heat or cold intolerance" value="" options="" propid="91" itemid="447284" categoryid="12508" encounterid="14036619"Heat or cold intolerance no.  HEMATOLOGY/LYMPH no" label="Abnormal bleeding" value="" options="no,yes" propid="91" itemid="199152" categoryid="138157" encounterid="14036619"Abnormal bleeding no. no" label="Easy bruising" value="" options="no,yes" propid="91" itemid="170653" categoryid="138157" encounterid="14036619"Easy bruising no. no" label="Swollen glands" value="" options="no,yes" propid="91" itemid="138158" categoryid="138157" encounterid="14036619"Swollen glands no.  DERMATOLOGY no" label="New/changing skin lesion" value="" options="no,yes" propid="91" itemid="199126" categoryid="12503" encounterid="14036619"New/changing skin lesion no. no" label="Rash" value="" options="no,yes" propid="91" itemid="12504" categoryid="12503" encounterid="14036619"Rash no. no" label="Sores" value="" options="" propid="91" itemid="444313" categoryid="12503"  encounterid="[id number]"Sores no.  Negative except as stated in HPI.  Objective: Vitals: Wt 174.2, Wt change -2.6 lb, Ht 60.25, BMI 33.74, Pulse sitting 71, BP sitting 129/87.  Past Results: Examination:  General Examination alert, oriented, NAD " label="CONSTITUTIONAL:" categoryPropId="10089" examid="193638"CONSTITUTIONAL: alert, oriented, NAD .  moist, warm" label="SKIN:" categoryPropId="10109" examid="193638"SKIN: moist, warm.  Conjunctiva clear"  label="EYES:" categoryPropId="21468" examid="193638"EYES: Conjunctiva clear.  good I:E efffort noted, clear to auscultation bilaterally" label="LUNGS:" categoryPropId="87" examid="193638"LUNGS: good I:E efffort noted, clear to auscultation bilaterally.  regular rate and rhythm" label="HEART:" categoryPropId="86" examid="193638"HEART: regular rate and rhythm.  soft, non-tender/non-distended, bowel sounds present " label="ABDOMEN:" categoryPropId="88" examid="193638"ABDOMEN: soft, non-tender/non-distended, bowel sounds present .  normal external genitalia, labia - unremarkable, vagina - pink moist mucosa, no lesions or abnormal discharge, cervix - no discharge or lesions or CMT, adnexa - no masses or tenderness, uterus - nontender and normal size on palpation " label="FEMALE GENITOURINARY:" categoryPropId="13414" examid="193638"FEMALE GENITOURINARY: normal external genitalia, labia - unremarkable, vagina - pink moist mucosa, no lesions or abnormal discharge, cervix - no discharge or lesions or CMT, adnexa - no masses or tenderness, uterus - nontender and normal size on palpation .  affect normal, good eye contact" label="PSYCH:" categoryPropId="16316" examid="193638"PSYCH: affect normal, good eye contact.  Physical Examination: Pt aware of scribe services today.   Assessment: Assessment:  Unspecified contraceptive management - Z30.9 (Primary)     Plan: Treatment: Unspecified contraceptive management Notes: Pt desires permanent sterilization via  tubal ligation. Discussed risks of BTL including but not limited to infection, bleeding, damage to her bowel and surrounding organs with the need for further surgery. . Pt accepts this risk and desires to proceed. 1 % r/o failure with 50 % r/o ectopic if failure occrus. she is advised to avoid eating after midnight the night prior to surgery.

## 2020-12-01 NOTE — H&P (Deleted)
  The note originally documented on this encounter has been moved the the encounter in which it belongs.  

## 2020-12-08 ENCOUNTER — Encounter (HOSPITAL_BASED_OUTPATIENT_CLINIC_OR_DEPARTMENT_OTHER)
Admission: RE | Admit: 2020-12-08 | Discharge: 2020-12-08 | Disposition: A | Discharge status: Home / Self Care | Payer: BC Managed Care – PPO | Source: Ambulatory Visit | Attending: Obstetrics and Gynecology | Admitting: Obstetrics and Gynecology

## 2020-12-08 DIAGNOSIS — Z01812 Encounter for preprocedural laboratory examination: Secondary | ICD-10-CM | POA: Insufficient documentation

## 2020-12-08 DIAGNOSIS — Z302 Encounter for sterilization: Secondary | ICD-10-CM | POA: Diagnosis not present

## 2020-12-08 DIAGNOSIS — Z8759 Personal history of other complications of pregnancy, childbirth and the puerperium: Secondary | ICD-10-CM | POA: Diagnosis not present

## 2020-12-08 LAB — CBC
HCT: 39.5 % (ref 36.0–46.0)
Hemoglobin: 13.7 g/dL (ref 12.0–15.0)
MCH: 31.2 pg (ref 26.0–34.0)
MCHC: 34.7 g/dL (ref 30.0–36.0)
MCV: 90 fL (ref 80.0–100.0)
Platelets: 228 10*3/uL (ref 150–400)
RBC: 4.39 MIL/uL (ref 3.87–5.11)
RDW: 12.8 % (ref 11.5–15.5)
WBC: 5.8 10*3/uL (ref 4.0–10.5)
nRBC: 0 % (ref 0.0–0.2)

## 2020-12-08 NOTE — Progress Notes (Signed)
Sent message reminding pt to come in for lab work.  

## 2020-12-09 ENCOUNTER — Encounter (HOSPITAL_BASED_OUTPATIENT_CLINIC_OR_DEPARTMENT_OTHER): Payer: Self-pay | Admitting: Obstetrics and Gynecology

## 2020-12-09 ENCOUNTER — Ambulatory Visit (HOSPITAL_BASED_OUTPATIENT_CLINIC_OR_DEPARTMENT_OTHER)
Admission: RE | Admit: 2020-12-09 | Discharge: 2020-12-09 | Disposition: A | Discharge status: Home / Self Care | Payer: BC Managed Care – PPO | Source: Ambulatory Visit | Attending: Obstetrics and Gynecology | Admitting: Obstetrics and Gynecology

## 2020-12-09 ENCOUNTER — Ambulatory Visit (HOSPITAL_BASED_OUTPATIENT_CLINIC_OR_DEPARTMENT_OTHER): Payer: BC Managed Care – PPO | Admitting: Anesthesiology

## 2020-12-09 ENCOUNTER — Other Ambulatory Visit: Payer: Self-pay

## 2020-12-09 ENCOUNTER — Encounter (HOSPITAL_BASED_OUTPATIENT_CLINIC_OR_DEPARTMENT_OTHER)
Admission: RE | Disposition: A | Discharge status: Home / Self Care | Payer: Self-pay | Source: Ambulatory Visit | Attending: Obstetrics and Gynecology

## 2020-12-09 DIAGNOSIS — Z302 Encounter for sterilization: Secondary | ICD-10-CM | POA: Diagnosis not present

## 2020-12-09 DIAGNOSIS — Z8759 Personal history of other complications of pregnancy, childbirth and the puerperium: Secondary | ICD-10-CM | POA: Insufficient documentation

## 2020-12-09 HISTORY — PX: LAPAROSCOPIC TUBAL LIGATION: SHX1937

## 2020-12-09 LAB — POCT PREGNANCY, URINE: Preg Test, Ur: NEGATIVE

## 2020-12-09 SURGERY — LIGATION, FALLOPIAN TUBE, LAPAROSCOPIC
Anesthesia: General | Site: Vagina | Laterality: Bilateral

## 2020-12-09 MED ORDER — ACETAMINOPHEN 160 MG/5ML PO SOLN: 325.0000 mg | ORAL | Status: DC | PRN

## 2020-12-09 MED ORDER — MEPERIDINE HCL 25 MG/ML IJ SOLN: 6.2500 mg | INTRAMUSCULAR | Status: DC | PRN

## 2020-12-09 MED ORDER — MIDAZOLAM HCL 5 MG/5ML IJ SOLN
INTRAMUSCULAR | Status: DC | PRN
Start: 2020-12-09 — End: 2020-12-09
  Administered 2020-12-09: 2 mg via INTRAVENOUS

## 2020-12-09 MED ORDER — LIDOCAINE HCL (CARDIAC) PF 100 MG/5ML IV SOSY
PREFILLED_SYRINGE | INTRAVENOUS | Status: DC | PRN
  Administered 2020-12-09: 60 mg via INTRAVENOUS

## 2020-12-09 MED ORDER — LACTATED RINGERS IV SOLN: INTRAVENOUS | Status: DC

## 2020-12-09 MED ORDER — OXYCODONE HCL 5 MG/5ML PO SOLN: 5.0000 mg | Freq: Once | ORAL | Status: DC | PRN

## 2020-12-09 MED ORDER — DEXMEDETOMIDINE (PRECEDEX) IN NS 20 MCG/5ML (4 MCG/ML) IV SYRINGE
PREFILLED_SYRINGE | INTRAVENOUS | Status: DC | PRN
  Administered 2020-12-09: 12 ug via INTRAVENOUS

## 2020-12-09 MED ORDER — ACETAMINOPHEN 500 MG PO TABS
1000.0000 mg | ORAL_TABLET | ORAL | Status: AC
  Administered 2020-12-09: 1000 mg via ORAL

## 2020-12-09 MED ORDER — CEFAZOLIN SODIUM-DEXTROSE 2-4 GM/100ML-% IV SOLN
INTRAVENOUS | Status: AC
  Filled 2020-12-09: qty 100

## 2020-12-09 MED ORDER — PROPOFOL 10 MG/ML IV BOLUS
INTRAVENOUS | Status: DC | PRN
  Administered 2020-12-09: 150 mg via INTRAVENOUS

## 2020-12-09 MED ORDER — ROCURONIUM BROMIDE 100 MG/10ML IV SOLN
INTRAVENOUS | Status: DC | PRN
Start: 2020-12-09 — End: 2020-12-09
  Administered 2020-12-09: 60 mg via INTRAVENOUS

## 2020-12-09 MED ORDER — FENTANYL CITRATE (PF) 100 MCG/2ML IJ SOLN
INTRAMUSCULAR | Status: DC | PRN
  Administered 2020-12-09 (×2): 50 ug via INTRAVENOUS

## 2020-12-09 MED ORDER — SUGAMMADEX SODIUM 200 MG/2ML IV SOLN
INTRAVENOUS | Status: DC | PRN
  Administered 2020-12-09: 200 mg via INTRAVENOUS

## 2020-12-09 MED ORDER — DEXMEDETOMIDINE (PRECEDEX) IN NS 20 MCG/5ML (4 MCG/ML) IV SYRINGE
PREFILLED_SYRINGE | INTRAVENOUS | Status: AC
  Filled 2020-12-09: qty 15

## 2020-12-09 MED ORDER — FENTANYL CITRATE (PF) 100 MCG/2ML IJ SOLN: 25.0000 ug | INTRAMUSCULAR | Status: DC | PRN

## 2020-12-09 MED ORDER — OXYCODONE HCL 5 MG PO TABS: 5.0000 mg | ORAL_TABLET | ORAL | 0 refills | Status: AC | PRN

## 2020-12-09 MED ORDER — MIDAZOLAM HCL 2 MG/2ML IJ SOLN
INTRAMUSCULAR | Status: AC
  Filled 2020-12-09: qty 2

## 2020-12-09 MED ORDER — OXYCODONE HCL 5 MG PO TABS: 5.0000 mg | ORAL_TABLET | Freq: Once | ORAL | Status: DC | PRN

## 2020-12-09 MED ORDER — FENTANYL CITRATE (PF) 100 MCG/2ML IJ SOLN
INTRAMUSCULAR | Status: AC
  Filled 2020-12-09: qty 2

## 2020-12-09 MED ORDER — CEFAZOLIN SODIUM-DEXTROSE 2-4 GM/100ML-% IV SOLN
2.0000 g | INTRAVENOUS | Status: AC
  Administered 2020-12-09: 2 g via INTRAVENOUS

## 2020-12-09 MED ORDER — IBUPROFEN 800 MG PO TABS: 800.0000 mg | ORAL_TABLET | Freq: Three times a day (TID) | ORAL | 0 refills | Status: AC | PRN

## 2020-12-09 MED ORDER — KETOROLAC TROMETHAMINE 30 MG/ML IJ SOLN
INTRAMUSCULAR | Status: DC | PRN
  Administered 2020-12-09: 30 mg via INTRAVENOUS

## 2020-12-09 MED ORDER — ACETAMINOPHEN 325 MG PO TABS: 325.0000 mg | ORAL_TABLET | ORAL | Status: DC | PRN

## 2020-12-09 MED ORDER — ONDANSETRON HCL 4 MG/2ML IJ SOLN
INTRAMUSCULAR | Status: DC | PRN
  Administered 2020-12-09: 4 mg via INTRAVENOUS

## 2020-12-09 MED ORDER — ONDANSETRON HCL 4 MG/2ML IJ SOLN: 4.0000 mg | Freq: Once | INTRAMUSCULAR | Status: DC | PRN

## 2020-12-09 MED ORDER — ACETAMINOPHEN 500 MG PO TABS
ORAL_TABLET | ORAL | Status: AC
  Filled 2020-12-09: qty 2

## 2020-12-09 MED ORDER — BUPIVACAINE HCL (PF) 0.25 % IJ SOLN
INTRAMUSCULAR | Status: DC | PRN
  Administered 2020-12-09: 10 mL
  Administered 2020-12-09: 20 mL

## 2020-12-09 SURGICAL SUPPLY — 20 items
DERMABOND ADVANCED (GAUZE/BANDAGES/DRESSINGS) ×1
DERMABOND ADVANCED .7 DNX12 (GAUZE/BANDAGES/DRESSINGS) ×1 IMPLANT
DURAPREP 26ML APPLICATOR (WOUND CARE) ×2 IMPLANT
GAUZE 4X4 16PLY ~~LOC~~+RFID DBL (SPONGE) ×2 IMPLANT
GLOVE SURG ENC TEXT LTX SZ6.5 (GLOVE) ×2 IMPLANT
GLOVE SURG UNDER POLY LF SZ6.5 (GLOVE) ×4 IMPLANT
GLOVE SURG UNDER POLY LF SZ7 (GLOVE) ×4 IMPLANT
GOWN STRL REUS W/TWL LRG LVL3 (GOWN DISPOSABLE) ×6 IMPLANT
PACK LAPAROSCOPY BASIN (CUSTOM PROCEDURE TRAY) ×2 IMPLANT
PACK TRENDGUARD 450 HYBRID PRO (MISCELLANEOUS) ×1 IMPLANT
PAD OB MATERNITY 4.3X12.25 (PERSONAL CARE ITEMS) ×2 IMPLANT
SCISSORS LAP 5X45 EPIX DISP (ENDOMECHANICALS) ×2 IMPLANT
SLEEVE SCD COMPRESS KNEE MED (STOCKING) ×2 IMPLANT
SOLUTION ELECTROLUBE (MISCELLANEOUS) ×2 IMPLANT
SUT VICRYL 0 UR6 27IN ABS (SUTURE) ×2 IMPLANT
SUT VICRYL 4-0 PS2 18IN ABS (SUTURE) ×2 IMPLANT
TOWEL GREEN STERILE FF (TOWEL DISPOSABLE) ×2 IMPLANT
TRENDGUARD 450 HYBRID PRO PACK (MISCELLANEOUS) ×2
TROCAR XCEL NON-BLD 11X100MML (ENDOMECHANICALS) ×2 IMPLANT
WARMER LAPAROSCOPE (MISCELLANEOUS) ×2 IMPLANT

## 2020-12-09 NOTE — H&P (Signed)
Date of Initial H&P: 12/01/2020  History reviewed, patient examined, no change in status, stable for surgery.  

## 2020-12-09 NOTE — Anesthesia Procedure Notes (Signed)
Procedure Name: Intubation Date/Time: 12/09/2020 2:05 PM Performed by: Signe Colt, CRNA Pre-anesthesia Checklist: Patient identified, Emergency Drugs available, Suction available and Patient being monitored Patient Re-evaluated:Patient Re-evaluated prior to induction Oxygen Delivery Method: Circle system utilized Preoxygenation: Pre-oxygenation with 100% oxygen Induction Type: IV induction Ventilation: Mask ventilation without difficulty Laryngoscope Size: Mac and 3 Grade View: Grade I Tube type: Oral Tube size: 7.0 mm Number of attempts: 1 Airway Equipment and Method: Stylet and Oral airway Placement Confirmation: ETT inserted through vocal cords under direct vision, positive ETCO2 and breath sounds checked- equal and bilateral Secured at: 21 cm Tube secured with: Tape Dental Injury: Teeth and Oropharynx as per pre-operative assessment

## 2020-12-09 NOTE — Discharge Instructions (Signed)
  Post Anesthesia Home Care Instructions  Activity: Get plenty of rest for the remainder of the day. A responsible individual must stay with you for 24 hours following the procedure.  For the next 24 hours, DO NOT: -Drive a car -Advertising copywriter -Drink alcoholic beverages -Take any medication unless instructed by your physician -Make any legal decisions or sign important papers.  Meals: Start with liquid foods such as gelatin or soup. Progress to regular foods as tolerated. Avoid greasy, spicy, heavy foods. If nausea and/or vomiting occur, drink only clear liquids until the nausea and/or vomiting subsides. Call your physician if vomiting continues.  Special Instructions/Symptoms: Your throat may feel dry or sore from the anesthesia or the breathing tube placed in your throat during surgery. If this causes discomfort, gargle with warm salt water. The discomfort should disappear within 24 hours.  If you had a scopolamine patch placed behind your ear for the management of post- operative nausea and/or vomiting:  1. The medication in the patch is effective for 72 hours, after which it should be removed.  Wrap patch in a tissue and discard in the trash. Wash hands thoroughly with soap and water. 2. You may remove the patch earlier than 72 hours if you experience unpleasant side effects which may include dry mouth, dizziness or visual disturbances. 3. Avoid touching the patch. Wash your hands with soap and water after contact with the patch.      Next dose of Tylenol at home after 6pm for pain as needed.

## 2020-12-09 NOTE — Transfer of Care (Signed)
Immediate Anesthesia Transfer of Care Note  Patient: Morgan Howell  Procedure(s) Performed: LAPAROSCOPIC BILATERAL TUBAL LIGATION (Bilateral: Vagina )  Patient Location: PACU  Anesthesia Type:General  Level of Consciousness: awake, alert , oriented and patient cooperative  Airway & Oxygen Therapy: Patient Spontanous Breathing and Patient connected to face mask oxygen  Post-op Assessment: Report given to RN and Post -op Vital signs reviewed and stable  Post vital signs: Reviewed and stable  Last Vitals:  Vitals Value Taken Time  BP 140/92 12/09/20 1502  Temp    Pulse 74 12/09/20 1503  Resp 15 12/09/20 1503  SpO2 98 % 12/09/20 1503  Vitals shown include unvalidated device data.  Last Pain:  Vitals:   12/09/20 1150  TempSrc: Oral  PainSc: 0-No pain      Patients Stated Pain Goal: 8 (12/09/20 1150)  Complications: No notable events documented.

## 2020-12-09 NOTE — Anesthesia Preprocedure Evaluation (Signed)
Anesthesia Evaluation  Patient identified by MRN, date of birth, ID band Patient awake    Reviewed: Allergy & Precautions, H&P , NPO status , Patient's Chart, lab work & pertinent test results, reviewed documented beta blocker date and time   Airway Mallampati: II  TM Distance: >3 FB Neck ROM: full    Dental no notable dental hx.    Pulmonary neg pulmonary ROS,    Pulmonary exam normal breath sounds clear to auscultation       Cardiovascular Exercise Tolerance: Good (-) hypertension Rhythm:regular Rate:Normal     Neuro/Psych negative neurological ROS  negative psych ROS   GI/Hepatic negative GI ROS, Neg liver ROS,   Endo/Other  negative endocrine ROS  Renal/GU negative Renal ROS  negative genitourinary   Musculoskeletal   Abdominal   Peds  Hematology  (+) Blood dyscrasia, Sickle cell trait and anemia ,   Anesthesia Other Findings   Reproductive/Obstetrics negative OB ROS                             Anesthesia Physical Anesthesia Plan  ASA: 2  Anesthesia Plan: General   Post-op Pain Management:    Induction: Intravenous  PONV Risk Score and Plan: 3 and Ondansetron, Dexamethasone and Treatment may vary due to age or medical condition  Airway Management Planned: Oral ETT  Additional Equipment: None  Intra-op Plan:   Post-operative Plan: Extubation in OR  Informed Consent: I have reviewed the patients History and Physical, chart, labs and discussed the procedure including the risks, benefits and alternatives for the proposed anesthesia with the patient or authorized representative who has indicated his/her understanding and acceptance.     Dental Advisory Given  Plan Discussed with: CRNA and Anesthesiologist  Anesthesia Plan Comments: (  )        Anesthesia Quick Evaluation

## 2020-12-09 NOTE — Anesthesia Postprocedure Evaluation (Signed)
Anesthesia Post Note  Patient: Morgan Howell  Procedure(s) Performed: LAPAROSCOPIC BILATERAL TUBAL LIGATION (Bilateral: Vagina )     Patient location during evaluation: PACU Anesthesia Type: General Level of consciousness: awake and alert Pain management: pain level controlled Vital Signs Assessment: post-procedure vital signs reviewed and stable Respiratory status: spontaneous breathing, nonlabored ventilation, respiratory function stable and patient connected to nasal cannula oxygen Cardiovascular status: blood pressure returned to baseline and stable Postop Assessment: no apparent nausea or vomiting Anesthetic complications: no   No notable events documented.  Last Vitals:  Vitals:   12/09/20 1545 12/09/20 1559  BP: (!) 133/91 (!) 133/91  Pulse: 73 73  Resp: 17 20  Temp: 37.1 C 36.7 C  SpO2: 95% 95%    Last Pain:  Vitals:   12/09/20 1559  TempSrc: Oral  PainSc: 1                  Aland Chestnutt

## 2020-12-09 NOTE — Op Note (Signed)
12/09/2020  3:02 PM  PATIENT:  Morgan Howell  40 y.o. female  PRE-OPERATIVE DIAGNOSIS:  Sterilization  POST-OPERATIVE DIAGNOSIS:  Sterilization  PROCEDURE:  Procedure(s): LAPAROSCOPIC BILATERAL TUBAL LIGATION (Bilateral)  SURGEON:  Surgeon(s) and Role:    Gerald Leitz, MD - Primary  PHYSICIAN ASSISTANT:   ASSISTANTS: none   ANESTHESIA:   general  EBL:  10 cc    BLOOD ADMINISTERED:none  DRAINS: none   LOCAL MEDICATIONS USED:  MARCAINE     SPECIMEN:  No Specimen  DISPOSITION OF SPECIMEN:  N/A  COUNTS:  YES  TOURNIQUET:  * No tourniquets in log *  DICTATION: .Note written in EPIC  PLAN OF CARE: Discharge to home after PACU  PATIENT DISPOSITION:  PACU - hemodynamically stable.   Delay start of Pharmacological VTE agent (>24hrs) due to surgical blood loss or risk of bleeding: not applicable  Findings normal appearing external genitalia / vaginal mucosa and cervix... Normal appearing fallopian tubes and ovaries bilaterally. Enlarged Uterus   Procedure: The patient was taken to the operating room where she was placed under general anesthesia. She is placed in the dorsolithotomy position. She was prepped and draped in the usual sterile fashion. Speculum was placed into the vaginal vault. In the anterior lip of the cervix grasped with a single-tooth tenaculum. A uterine manipulator was placed without difficulty. The single-tooth tenaculum was  Removed. The speculum was removed.  Attention was turned to the umbilicus which was injected with 10 cc of quarter percent Marcaine. A 11 mm incision was made with the scalpel. A 11 mm trocar was placed under direct visualization. Entry into the peritoneum was visualized. The pneumoperitoneum was achieved with CO2 gas. The pelvis was examined.  Her  fallopian tubes and ovaries appeared normal.  Operative scope was inserted . The  right fallopian tube was identified and followed out to the fimbriated end . The fallopian tube was  cauterized using the Kleppenger Along the ampullary  portion of the right fallopian  tube without difficulty.  This was repeated on the left fallopian tube . A  laparoscopic needle was inserted and 10 cc of quarter percent Marcaine was placed along the ampullary portion of the tubes  bilaterally. Pneumoperitoneum was released. The umbilical trocar was removed under direct visualization. The fascia was closed with 0 Vicryl. The skin was closed with 4-0 Vicryl. The dermabond placed a over the skin incision.  Patient was awakened from anesthesia taken to the recovery room awake and in stable condition.

## 2020-12-14 ENCOUNTER — Encounter (HOSPITAL_BASED_OUTPATIENT_CLINIC_OR_DEPARTMENT_OTHER): Payer: Self-pay | Admitting: Obstetrics and Gynecology
